# Patient Record
Sex: Female | Born: 1965 | Race: Black or African American | Hispanic: No | Marital: Single
Health system: Southern US, Community
[De-identification: ages and names within clinical notes are randomized; demographics above are authoritative.]

## PROBLEM LIST (undated history)

## (undated) DIAGNOSIS — I10 Essential (primary) hypertension: Secondary | ICD-10-CM

## (undated) DIAGNOSIS — E079 Disorder of thyroid, unspecified: Secondary | ICD-10-CM

## (undated) DIAGNOSIS — I671 Cerebral aneurysm, nonruptured: Secondary | ICD-10-CM

## (undated) DIAGNOSIS — I4719 Other supraventricular tachycardia: Secondary | ICD-10-CM

## (undated) DIAGNOSIS — R002 Palpitations: Secondary | ICD-10-CM

## (undated) HISTORY — DX: Essential (primary) hypertension: I10

## (undated) HISTORY — DX: Palpitations: R00.2

---

## 1994-05-21 ENCOUNTER — Ambulatory Visit: Admit: 1994-05-21 | Disposition: A | Discharge status: Home / Self Care | Payer: Self-pay | Source: Ambulatory Visit

## 1998-11-09 ENCOUNTER — Ambulatory Visit
Admit: 1998-11-09 | Disposition: A | Discharge status: Home / Self Care | Payer: Self-pay | Source: Ambulatory Visit | Admitting: Otolaryngology

## 2001-06-15 ENCOUNTER — Ambulatory Visit
Admit: 2001-06-15 | Disposition: A | Discharge status: Home / Self Care | Payer: Self-pay | Source: Ambulatory Visit | Admitting: Gynecology

## 2001-07-14 ENCOUNTER — Inpatient Hospital Stay
Admission: AD | Admit: 2001-07-14 | Disposition: A | Discharge status: Home / Self Care | Payer: Self-pay | Source: Ambulatory Visit | Admitting: Obstetrics & Gynecology

## 2005-08-16 ENCOUNTER — Ambulatory Visit
Admit: 2005-08-16 | Disposition: A | Discharge status: Home / Self Care | Payer: Self-pay | Source: Ambulatory Visit | Admitting: Internal Medicine

## 2007-10-06 ENCOUNTER — Ambulatory Visit
Admit: 2007-10-06 | Disposition: A | Discharge status: Home / Self Care | Payer: Self-pay | Source: Ambulatory Visit | Admitting: Internal Medicine

## 2008-02-19 ENCOUNTER — Ambulatory Visit
Admit: 2008-02-19 | Disposition: A | Discharge status: Home / Self Care | Payer: Self-pay | Source: Ambulatory Visit | Admitting: Internal Medicine

## 2011-10-23 ENCOUNTER — Ambulatory Visit (INDEPENDENT_AMBULATORY_CARE_PROVIDER_SITE_OTHER): Payer: Exclusive Provider Organization | Admitting: Physician Assistant

## 2011-10-23 ENCOUNTER — Encounter (INDEPENDENT_AMBULATORY_CARE_PROVIDER_SITE_OTHER): Payer: Self-pay | Admitting: Physician Assistant

## 2011-10-23 VITALS — BP 148/87 | Ht 67.0 in | Wt 210.0 lb

## 2011-10-23 DIAGNOSIS — N6009 Solitary cyst of unspecified breast: Secondary | ICD-10-CM

## 2011-10-23 DIAGNOSIS — I10 Essential (primary) hypertension: Secondary | ICD-10-CM

## 2011-10-23 DIAGNOSIS — I499 Cardiac arrhythmia, unspecified: Secondary | ICD-10-CM

## 2011-10-23 MED ORDER — LISINOPRIL 10 MG PO TABS
10.00 mg | ORAL_TABLET | Freq: Every day | ORAL | Status: DC
Start: 2011-10-23 — End: 2012-01-20

## 2011-10-23 NOTE — Progress Notes (Signed)
Subjective:       Patient ID: Nicole Mcknight is a 46 y.o. female.    HPI    The following portions of the patient's history were reviewed and updated as appropriate: allergies, current medications, past family history, past medical history, past social history, past surgical history and problem list.    Review of Systems        Objective:    Physical Exam  EKG- Sinus arrhythmia, PVS's noted, no ischemic findings      Assessment:             Plan:

## 2011-10-23 NOTE — Progress Notes (Signed)
Subjective:       Patient ID: Nicole Mcknight is a 46 y.o. female.    HPI  New Patient  Hx HTN- previously controlled with Lisinopril, but she has only 7 pills left and using them sparingly. Last dose was 2 days ago.  Hx- fibrocystic breasts. Her last mammo was 09/26/2011 which confirmed fibrocystic breast and she will need to have a FNA done (needs referral). She has noticed a new R breast lump since her mammo as well- it is tender to the touch.    Pt also c/o chest palpitations for 2 weeks now. Sx are occuring several times/day and has occasional SOB associated, no CP noted. She has cut out coffee and alcohol since sx began. Does admit to increase stress past 2 weeks- her son just got married.   She was seen by cardio several years ago for similar sx which resolved at that time.    The following portions of the patient's history were reviewed and updated as appropriate: allergies, current medications, past family history, past medical history, past social history, past surgical history and problem list.    Review of Systems   Constitutional: Negative for fever, activity change and fatigue.   HENT: Negative.    Respiratory: Positive for shortness of breath (occasional). Negative for wheezing.    Cardiovascular: Positive for palpitations. Negative for chest pain and leg swelling.   Gastrointestinal: Negative.    Genitourinary:        Breast lump/cysts   Neurological: Negative for dizziness, weakness, numbness and headaches.           Objective:    Physical Exam   Constitutional: She is oriented to person, place, and time. She appears well-developed and well-nourished. No distress.   HENT:   Head: Normocephalic.   Nose: Nose normal.   Mouth/Throat: Oropharynx is clear and moist.   Eyes: Pupils are equal, round, and reactive to light.   Neck: Normal range of motion. Neck supple. No thyromegaly present.   Cardiovascular: Normal rate, normal heart sounds and normal pulses.  An irregular rhythm present.  Extrasystoles  are present.   No murmur heard.  Pulmonary/Chest: Effort normal and breath sounds normal. No respiratory distress. Right breast exhibits mass and tenderness. Left breast exhibits mass and tenderness.       Lymphadenopathy:     She has no cervical adenopathy.   Neurological: She is alert and oriented to person, place, and time.   Skin: Skin is warm and dry.   Psychiatric: She has a normal mood and affect. Her behavior is normal. Judgment and thought content normal.           Assessment:       HTN  Frequent PVC's  Breast lumps/cysts      Plan:       1. Essential hypertension, benign  lisinopril (PRINIVIL,ZESTRIL) 10 MG tablet   2. Breast cyst  US Guided Aspiration Breast Cyst SGL Left, US Breast Right   3. Arrhythmia  Holter Monitor- 24 Hour, Holter Monitor- 24 Hour     Refilled Lisinopril  Arranged for Holter monitor today at [facility]. Will consult with Cardio after- may change BP med to beta blocker  Breast u/s and FNA - to be scheduled by pt

## 2011-10-27 ENCOUNTER — Encounter (INDEPENDENT_AMBULATORY_CARE_PROVIDER_SITE_OTHER): Payer: Self-pay

## 2011-11-24 ENCOUNTER — Encounter (INDEPENDENT_AMBULATORY_CARE_PROVIDER_SITE_OTHER): Payer: Self-pay | Admitting: Physician Assistant

## 2011-11-28 ENCOUNTER — Telehealth (INDEPENDENT_AMBULATORY_CARE_PROVIDER_SITE_OTHER): Payer: Self-pay

## 2011-11-28 NOTE — Telephone Encounter (Signed)
Left message for patient to call back.     No significant arrythmia noted. How is she feeling? Symptomatic?

## 2012-01-20 ENCOUNTER — Encounter (INDEPENDENT_AMBULATORY_CARE_PROVIDER_SITE_OTHER): Payer: Self-pay | Admitting: Physician Assistant

## 2012-01-20 ENCOUNTER — Ambulatory Visit (INDEPENDENT_AMBULATORY_CARE_PROVIDER_SITE_OTHER): Payer: Exclusive Provider Organization | Admitting: Physician Assistant

## 2012-01-20 VITALS — BP 155/90 | HR 75 | Temp 97.6°F | Ht 67.0 in | Wt 204.0 lb

## 2012-01-20 DIAGNOSIS — I10 Essential (primary) hypertension: Secondary | ICD-10-CM

## 2012-01-20 DIAGNOSIS — N390 Urinary tract infection, site not specified: Secondary | ICD-10-CM

## 2012-01-20 LAB — POCT URINALYSIS DIPSTIX (10)(MULTI-TEST)
Bilirubin, UA POCT: NEGATIVE
Glucose, UA POCT: NEGATIVE mg/dL
Ketones, UA POCT: NEGATIVE mg/dL
Nitrite, UA POCT: NEGATIVE
POCT Spec Gravity, UA: 1.03 (ref 1.001–1.035)
POCT pH, UA: 6 (ref 5–8)
Protein, UA POCT: NEGATIVE mg/dL

## 2012-01-20 MED ORDER — CIPROFLOXACIN HCL 500 MG PO TABS
500.00 mg | ORAL_TABLET | Freq: Two times a day (BID) | ORAL | Status: AC
Start: 2012-01-20 — End: 2012-01-23

## 2012-01-20 MED ORDER — LISINOPRIL 20 MG PO TABS
20.00 mg | ORAL_TABLET | Freq: Every day | ORAL | Status: DC
Start: 2012-01-20 — End: 2012-07-17

## 2012-01-20 NOTE — Progress Notes (Signed)
Subjective:       Patient ID: Nicole Mcknight is a 46 y.o. female.    HPI  Urinary Tract Infection: Patient complains of dysuria, frequency and urgency She has had symptoms for 1 week.  Patient denies back pain and fever. Patient does hot ave a history of recurrent UTI.  Patient does not have a history of pyelonephritis.     Pt with PMhx of HTN. She is compliant with Lisinopril 10mg  qd. She does not monitor BP at home.  BP elevated today.  Overall feels well, no HA, no CP, no SOB.  No new medication or OTC supplements.    The following portions of the patient's history were reviewed and updated as appropriate: allergies, current medications, past family history, past medical history, past social history, past surgical history and problem list.    Review of Systems   Constitutional: Negative for fever, chills, fatigue and unexpected weight change.   HENT: Negative.    Eyes: Negative for pain and visual disturbance.   Respiratory: Negative.  Negative for cough, chest tightness and shortness of breath.    Cardiovascular: Negative for chest pain, palpitations and leg swelling.   Gastrointestinal: Negative.  Negative for nausea, vomiting and abdominal pain.   Genitourinary: Positive for dysuria, urgency and frequency. Negative for flank pain, vaginal discharge and difficulty urinating.   Skin: Negative.    Neurological: Negative for weakness and headaches.           Objective:    Physical Exam   Vitals reviewed.  Constitutional: She is oriented to person, place, and time. She appears well-developed and well-nourished.   HENT:   Head: Normocephalic.   Nose: Nose normal.   Mouth/Throat: Oropharynx is clear and moist.   Eyes: Pupils are equal, round, and reactive to light.   Neck: Normal range of motion. Neck supple. Carotid bruit is not present. No thyromegaly present.   Cardiovascular: Normal rate and regular rhythm.  Exam reveals no gallop and no friction rub.    No murmur heard.  Pulmonary/Chest: Effort normal and  breath sounds normal. No respiratory distress.   Abdominal: Soft. Bowel sounds are normal. She exhibits no distension. There is no tenderness. There is no guarding and no CVA tenderness.   Musculoskeletal: She exhibits no edema.   Lymphadenopathy:     She has no cervical adenopathy.   Neurological: She is alert and oriented to person, place, and time.   Skin: Skin is warm and dry.           Assessment:       1. Essential hypertension, benign    2. UTI (lower urinary tract infection)            Plan:       1. Essential hypertension, benign  Increase lisinopril (PRINIVIL,ZESTRIL) 20 MG tablet qd   2. UTI (lower urinary tract infection)  ciprofloxacin (CIPRO) 500 MG tablet bid x 3d  Urine culture, POCT UA Dipstix (10)(Multi-Test)     Patient Instructions   Increase BP Medication Lisinopril to 20 mg/day  Follow up to recheck BP in 2-4 weeks    Low Sodium diet      Monitor BP at home    Risk & Benefits of the new medication(s) were explained to the pt (and family) who appeared to understand & agree to the treatment plan.

## 2012-01-20 NOTE — Assessment & Plan Note (Signed)
Relevant Hx:  Course:  Daily Update:  Today's Plan:

## 2012-01-20 NOTE — Patient Instructions (Signed)
Increase BP Medication Lisinopril to 20 mg/day  Follow up to recheck BP in 2-4 weeks    Low Sodium diet

## 2012-01-23 LAB — URINE CULTURE

## 2012-01-23 LAB — SENSITIVITY I

## 2012-02-19 ENCOUNTER — Telehealth (INDEPENDENT_AMBULATORY_CARE_PROVIDER_SITE_OTHER): Payer: Self-pay | Admitting: Physician Assistant

## 2012-02-19 NOTE — Telephone Encounter (Signed)
Left message for patient to call back  

## 2012-02-25 ENCOUNTER — Ambulatory Visit (INDEPENDENT_AMBULATORY_CARE_PROVIDER_SITE_OTHER): Payer: Exclusive Provider Organization | Admitting: Physician Assistant

## 2012-03-05 ENCOUNTER — Telehealth (INDEPENDENT_AMBULATORY_CARE_PROVIDER_SITE_OTHER): Payer: Self-pay

## 2012-03-05 NOTE — Telephone Encounter (Signed)
**  Is the patient taking current medications? YES/NO  Has there been any changes to current medication list? (additional meds/discontinued meds)    **Document any barriers/adverse reactions to current medications:  CURRENT BARRIERS TO TAKING MEDICATION:       **Has the patient sought any care outside of the [facility]? YES/NO    If YES:  **Patient has been instructed to bring/send records of incident at outside facility (HAVE PATIENT FILL OUT RECORD RELEASE FORM)    **Patient instructed to bring any self-referral information to the appointment.    **Instructed patient to bring in any log books they have. (BP/blood sugar, etc...) to the upcoming visit.

## 2012-03-10 ENCOUNTER — Ambulatory Visit (INDEPENDENT_AMBULATORY_CARE_PROVIDER_SITE_OTHER): Payer: Exclusive Provider Organization | Admitting: Physician Assistant

## 2012-04-14 ENCOUNTER — Ambulatory Visit (INDEPENDENT_AMBULATORY_CARE_PROVIDER_SITE_OTHER): Payer: Exclusive Provider Organization | Admitting: Physician Assistant

## 2012-04-14 ENCOUNTER — Encounter (INDEPENDENT_AMBULATORY_CARE_PROVIDER_SITE_OTHER): Payer: Exclusive Provider Organization | Admitting: Physician Assistant

## 2012-04-20 ENCOUNTER — Encounter (INDEPENDENT_AMBULATORY_CARE_PROVIDER_SITE_OTHER): Payer: Exclusive Provider Organization | Admitting: Physician Assistant

## 2012-04-29 ENCOUNTER — Encounter (INDEPENDENT_AMBULATORY_CARE_PROVIDER_SITE_OTHER): Payer: Exclusive Provider Organization | Admitting: Physician Assistant

## 2012-05-31 NOTE — Discharge Summary (Unsigned)
Nicole Mcknight, Nicole Mcknight      MEDICAL RECORD NUMBER:        [id number]            ATTENDING PHYSICIAN:          Jamesetta Orleans, MD      DATE OF ADMISSION:            07/13/2001      DATE OF DISCHARGE:            07/16/2001            ADMISSION DIAGNOSIS:                      Term pregnancy, active labor.            PROCEDURES:                               Spontaneous vaginal delivery,      viable female infant.  Repair of a first degree laceration.            HOSPITAL COURSE:                          The patient was admitted in      active labor at 8.0 cm, did well and she was admitted at 3:40 and was      delivered at 4:07.  Please see the delivery note.  She did well, as did the      baby post delivery.  She was transferred to [facility] where she      continued to do well and is ready for discharge on the morning of February      7th.  She has been given preprinted instructions.  She is to take Motrin      for discomfort.  She is to be seen in the office in six weeks.            ___________________________________      Jamesetta Orleans, MD            Dictated by: Spero Curb            :amg:ad      D:    07/16/2001      T:    07/20/2001      #:    [id number]      N:    [id number]            cc::  Spero Curb

## 2012-07-17 ENCOUNTER — Other Ambulatory Visit (INDEPENDENT_AMBULATORY_CARE_PROVIDER_SITE_OTHER): Payer: Self-pay | Admitting: Physician Assistant

## 2012-07-18 NOTE — Telephone Encounter (Signed)
Due for follow up

## 2012-07-19 NOTE — Telephone Encounter (Signed)
LM for patient to call back and schedule appt

## 2012-08-18 ENCOUNTER — Encounter (INDEPENDENT_AMBULATORY_CARE_PROVIDER_SITE_OTHER): Payer: Exclusive Provider Organization | Admitting: Physician Assistant

## 2012-08-20 ENCOUNTER — Other Ambulatory Visit (INDEPENDENT_AMBULATORY_CARE_PROVIDER_SITE_OTHER): Payer: Self-pay | Admitting: Physician Assistant

## 2012-08-24 ENCOUNTER — Encounter (INDEPENDENT_AMBULATORY_CARE_PROVIDER_SITE_OTHER): Payer: Exclusive Provider Organization | Admitting: Physician Assistant

## 2012-09-13 ENCOUNTER — Other Ambulatory Visit (INDEPENDENT_AMBULATORY_CARE_PROVIDER_SITE_OTHER): Payer: Self-pay | Admitting: Physician Assistant

## 2012-09-13 ENCOUNTER — Ambulatory Visit (INDEPENDENT_AMBULATORY_CARE_PROVIDER_SITE_OTHER): Payer: Exclusive Provider Organization | Admitting: Physician Assistant

## 2012-09-13 ENCOUNTER — Encounter (INDEPENDENT_AMBULATORY_CARE_PROVIDER_SITE_OTHER): Payer: Self-pay | Admitting: Physician Assistant

## 2012-09-13 VITALS — BP 142/92 | HR 79 | Ht 67.0 in | Wt 208.0 lb

## 2012-09-13 DIAGNOSIS — I10 Essential (primary) hypertension: Secondary | ICD-10-CM

## 2012-09-13 DIAGNOSIS — Z124 Encounter for screening for malignant neoplasm of cervix: Secondary | ICD-10-CM

## 2012-09-13 DIAGNOSIS — N926 Irregular menstruation, unspecified: Secondary | ICD-10-CM

## 2012-09-13 DIAGNOSIS — Z23 Encounter for immunization: Secondary | ICD-10-CM

## 2012-09-13 DIAGNOSIS — Z01419 Encounter for gynecological examination (general) (routine) without abnormal findings: Secondary | ICD-10-CM

## 2012-09-13 DIAGNOSIS — Z0000 Encounter for general adult medical examination without abnormal findings: Secondary | ICD-10-CM

## 2012-09-13 MED ORDER — LISINOPRIL-HYDROCHLOROTHIAZIDE 20-12.5 MG PO TABS
1.0000 | ORAL_TABLET | Freq: Every day | ORAL | Status: DC
Start: 2012-09-13 — End: 2013-02-04

## 2012-09-13 NOTE — Progress Notes (Signed)
Subjective:       Patient ID: Nicole Mcknight is a 47 y.o. female.    HPI  Patient Presents for an annual well visit and for GYN visit today.  Overall feeling well.  PMHx-HTN  Diet - semi healthy, does NOT follow low sodium diet  Works - for [employer] -  occasional walk or tennis game  Dental UTD  Vision exam - has never had  Vaccines - Td today  Last pap/pelvic - 2013  Patient's last menstrual period was 08/31/2012.  However, she has had mid cycle spotting for 2 months.   Last mammo - 2013, fibrocystic breast noted. U/S guided bx advised, pt she has not had this done      The following portions of the patient's history were reviewed and updated as appropriate: allergies, current medications, past family history, past medical history, past social history, past surgical history and problem list.    Review of Systems   Constitutional: Negative for fever, activity change, fatigue and unexpected weight change.   HENT: Negative for hearing loss, congestion, sore throat, rhinorrhea and neck pain.    Eyes: Negative for discharge and visual disturbance.   Respiratory: Negative for cough and shortness of breath.    Cardiovascular: Negative for chest pain, palpitations and leg swelling.   Gastrointestinal: Negative for nausea, vomiting, abdominal pain, diarrhea, constipation and blood in stool.   Genitourinary: Positive for menstrual problem (mid cycle bleeding x 2 mos). Negative for dysuria, urgency, frequency, vaginal discharge, difficulty urinating, vaginal pain, pelvic pain and dyspareunia.   Musculoskeletal: Negative.  Negative for back pain, arthralgias and gait problem.   Skin: Negative for rash.   Neurological: Negative for dizziness, weakness, numbness and headaches.   Psychiatric/Behavioral: Negative for behavioral problems, sleep disturbance and dysphoric mood. The patient is not nervous/anxious.    All other systems reviewed and are negative.            Objective:    Physical Exam   Vitals  reviewed.  Constitutional: She is oriented to person, place, and time. She appears well-developed and well-nourished.   HENT:   Head: Normocephalic.   Right Ear: Tympanic membrane normal.   Left Ear: Tympanic membrane normal.   Nose: Nose normal.   Mouth/Throat: Oropharynx is clear and moist.   Eyes: Conjunctivae normal are normal. Pupils are equal, round, and reactive to light.   Neck: Normal range of motion. Neck supple. No thyromegaly present.   Cardiovascular: Normal rate, regular rhythm and normal heart sounds.    No murmur heard.  Pulmonary/Chest: Effort normal and breath sounds normal. No respiratory distress. Right breast exhibits no mass, no nipple discharge and no tenderness. Left breast exhibits no mass, no nipple discharge and no tenderness. Breasts are symmetrical.   Abdominal: Bowel sounds are normal. She exhibits no distension and no mass. There is no tenderness. There is no guarding.   Genitourinary: Vagina normal and uterus normal. No breast swelling or tenderness. Cervix exhibits no motion tenderness, no discharge and no friability. Right adnexum displays no mass. Left adnexum displays no mass. No vaginal discharge found.   Musculoskeletal: Normal range of motion. She exhibits no edema.   Lymphadenopathy:     She has no cervical adenopathy.        Right: No inguinal adenopathy present.        Left: No inguinal adenopathy present.   Neurological: She is alert and oriented to person, place, and time.   Skin: Skin is warm  and dry.   Psychiatric: She has a normal mood and affect.     EKG- NSR, No acute findings        Assessment:       1. Routine general medical examination at a health care facility    2. Routine gynecological examination    3. Essential hypertension, benign    4. Irregular menses    5. Need for TD vaccine            Plan:       1. Routine general medical examination at a health care facility  Lipid panel, Comprehensive metabolic panel, CBC and differential, Vitamin D 25 hydroxy,  Urinalysis, TSH   2. Routine gynecological examination  PAP IG, HPV-HR Profile, Mammo Digital Screening Bilateral W Cad   3. Essential hypertension, benign  PR ECG ROUTINE ECG W/LEAST 12 LDS W/I&R, Ambulatory referral to Ophthalmology,   STOP LISINOPRIL 20 mg qd  START lisinopril-hydrochlorothiazide (ZESTORETIC) 20-12.5 MG per tablet   4. Irregular menses  US Pelvis Complete   5. Need for TD vaccine  Td vaccine greater than or equal to 7yo with preservative IM     Patient Instructions     --Nutrition: Stressed importance of moderation in sodium/caffeine intake, saturated fat and cholesterol, caloric balance, sufficient intake of fresh fruits, vegetables, fiber, calcium, iron   --Discussed the issue of calcium supplement, and the daily use of baby aspirin (81 mg)   --Discussed self breast exam   --Exercise: Stressed the importance of regular exercise, 30 minutes daily 5 times per week.   --Dental health: Discussed importance of regular tooth brushing, flossing, and dental visits.   --Immunizations reviewed. 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  Screening labs requested  Dietary sodium restriction.  Regular aerobic exercise.  Check blood pressures 2 times weekly and record.  Follow up: 4 weeks and as needed.

## 2012-09-13 NOTE — Patient Instructions (Signed)
--  Nutrition: Stressed importance of moderation in sodium/caffeine intake, saturated fat and cholesterol, caloric balance, sufficient intake of fresh fruits, vegetables, fiber, calcium, iron   --Discussed the issue of calcium supplement, and the daily use of baby aspirin (81 mg)   --Discussed self breast exam   --Exercise: Stressed the importance of regular exercise, 30 minutes daily 5 times per week.   --Dental health: Discussed importance of regular tooth brushing, flossing, and dental visits.   --Immunizations reviewed. UTD   --Discussed benefits of screening colon cancer with Colonoscopy starting at age 72  --Pap results in approx 1 week  --Mammo requested  --Labs as requested    Hypertension plan and Goals  Goal for blood pressure level: 130/90  Medication: discontinue Lisinopril 20mg  and begin Lisinopril HCTZ 20/12.5 mg.  Screening labs requested  Dietary sodium restriction.  Regular aerobic exercise.  Check blood pressures 2 times weekly and record.  Follow up: 4 weeks and as needed.

## 2012-09-16 LAB — VITAMIN D-25 HYDROXY (D2/D3/TOTAL)
25-Hydroxy D2: <4 ng/mL
Vitamin D 25-OH D3: 7 ng/mL
Vitamin D 25-OH Total: 7 ng/mL — ABNORMAL LOW (ref 30–100)

## 2012-09-16 LAB — CBC AND DIFFERENTIAL
Atypical Lymphocytes %: 0 %
Baso(Absolute): 34 cells/uL (ref 0–200)
Basophils: 0.5 %
Eosinophils Absolute: 60 cells/uL (ref 15–500)
Eosinophils: 0.9 %
Hematocrit: 42.7 % (ref 35.0–45.0)
Hemoglobin: 13.9 g/dL (ref 11.7–15.5)
Lymphocytes Absolute: 1005 cells/uL (ref 850–3900)
Lymphocytes: 15 %
MCH: 30.9 pg (ref 27–33)
MCHC: 32.5 g/dL (ref 32–36)
MCV: 95 fL (ref 80–100)
MPV: 10.1 fL (ref 7.5–11.5)
Monocytes Absolute: 529 cells/uL (ref 200–950)
Monocytes: 7.9 %
Neutrophils Absolute: 5072 cells/uL (ref 1500–7800)
Neutrophils: 75.7 %
Platelets: 240 10*3/uL (ref 140–400)
RBC: 4.49 10*6/uL (ref 3.80–5.10)
RDW: 14 % (ref 11.0–15.0)
WBC: 6.7 10*3/uL (ref 3.8–10.8)

## 2012-09-16 LAB — LIPID PANEL
Cholesterol / HDL Ratio: 1.8 (ref 0.0–5.0)
Cholesterol: 125 MG/DL (ref 125–200)
HDL: 70 MG/DL (ref >=46)
LDL Calculated: 47 MG/DL (ref <130)
Non HDL Cholesterol (LDL and VLDL): 55 mg/dL
Triglycerides: 38 MG/DL (ref <150)

## 2012-09-16 LAB — URINALYSIS
Bilirubin, UA: NEGATIVE
Blood, UA: NEGATIVE
Glucose Qualitative: NEGATIVE
Ketones UA: NEGATIVE
Leukocyte Esterase, UA: NEGATIVE
NITRITE: NEGATIVE
Protein, UA: NEGATIVE
Specific Gravity, UA: 1.012 (ref 1.001–1.035)
pH: 8 (ref 5.0–8.0)

## 2012-09-16 LAB — COMPREHENSIVE METABOLIC PANEL
ALT: 9 U/L (ref 6–29)
AST (SGOT): 15 U/L (ref 10–35)
Albumin/Globulin Ratio: 1.2 (ref 1.0–2.5)
Albumin: 4.1 G/DL (ref 3.6–5.1)
Alkaline Phosphatase: 100 U/L (ref 33–115)
BUN: 10 MG/DL (ref 7–25)
Bilirubin, Total: 0.5 MG/DL (ref 0.2–1.2)
CO2: 22 mmol/L (ref 19–30)
Calcium: 9.2 MG/DL (ref 8.6–10.2)
Chloride: 104 mmol/L (ref 98–110)
Creatinine: 0.78 mg/dL (ref 0.50–1.10)
EGFR African American: 104 mL/min/{1.73_m2} (ref >=60)
EGFR: 90 mL/min/{1.73_m2} (ref >=60)
Globulin: 3.3 G/DL (ref 1.9–3.7)
Glucose: 73 MG/DL (ref 65–99)
Potassium: 4.2 mmol/L (ref 3.5–5.3)
Protein, Total: 7.4 G/DL (ref 6.1–8.1)
Sodium: 141 mmol/L (ref 135–146)

## 2012-09-16 LAB — TSH: TSH: 1.57 mIU/L (ref 0.40–4.50)

## 2012-09-17 ENCOUNTER — Other Ambulatory Visit (INDEPENDENT_AMBULATORY_CARE_PROVIDER_SITE_OTHER): Payer: Self-pay | Admitting: Physician Assistant

## 2012-09-17 LAB — THINPREP IMAGING PAP & HPV MRNA E6/E7.

## 2012-09-17 LAB — HPV DNA, HIGH RISK: HPV DNA, High Risk: NOT DETECTED

## 2012-09-17 MED ORDER — VITAMIN D (ERGOCALCIFEROL) 1.25 MG (50000 UT) PO CAPS
ORAL_CAPSULE | ORAL | Status: DC
Start: 2012-09-17 — End: 2013-05-23

## 2012-10-13 ENCOUNTER — Ambulatory Visit (INDEPENDENT_AMBULATORY_CARE_PROVIDER_SITE_OTHER): Payer: Exclusive Provider Organization | Admitting: Physician Assistant

## 2012-10-26 ENCOUNTER — Ambulatory Visit (INDEPENDENT_AMBULATORY_CARE_PROVIDER_SITE_OTHER): Payer: Exclusive Provider Organization | Admitting: Physician Assistant

## 2012-10-26 ENCOUNTER — Encounter (INDEPENDENT_AMBULATORY_CARE_PROVIDER_SITE_OTHER): Payer: Self-pay | Admitting: Physician Assistant

## 2012-10-26 VITALS — BP 150/88 | HR 60 | Temp 99.2°F | Ht 67.0 in | Wt 208.0 lb

## 2012-10-26 DIAGNOSIS — E559 Vitamin D deficiency, unspecified: Secondary | ICD-10-CM

## 2012-10-26 DIAGNOSIS — I10 Essential (primary) hypertension: Secondary | ICD-10-CM

## 2012-10-26 NOTE — Assessment & Plan Note (Signed)
Relevant Hx:  Course:  Daily Update:  Today's Plan:

## 2012-10-26 NOTE — Progress Notes (Signed)
Has the patient sought any care outside of the [facility]? NO    1.  Over the last two weeks, have you been bothered by feeling  down, depressed, or hopeless?  3    2.  Over the last two weeks, have you been bothered by little  interest or pleasure in doing things? 1        Scoring:    Not at all: 0  Several days: 1  More than half the days:2  Nearly every day: 3      ONLY IF A 3 IS SCORED ON EITHER OF THE 2 QUESTIONS, THEN GIVE OUT PHQ9

## 2012-10-26 NOTE — Progress Notes (Signed)
Subjective:       Patient ID: Nicole Mcknight is a 47 y.o. female.    HPI  Problem   Vitamin D Deficiency    Pt with newly diagnosed Vit D Deficency. She was prescribed Vit D 50,000units q week but has not started taking yet.    Lab Results   Component Value Date    VITD 7* 09/14/2012    VITD 7 09/14/2012          Essential Hypertension, Benign    [facility] Letter given 01/20/12  Hypertension: Patient is here for evaluation of elevated blood pressures.   She did NOT start the Lisinopril HCT 20/12.5mg  after her last CPE. She was afraid of experiencing SE to new med.    Cardiac symptoms none.   Patient denies chest pain, dyspnea, lower extremity edema and palpitations.    Cardiovascular risk factors: hypertension.   Use of agents associated with hypertension: none.   History of target organ damage: none    BP Readings from Last 3 Encounters:   10/26/12 150/88   09/13/12 142/92   01/20/12 155/90                  The following portions of the patient's history were reviewed and updated as appropriate: allergies, current medications, past family history, past medical history, past social history, past surgical history and problem list.    Review of Systems   Constitutional: Negative for fever, fatigue and unexpected weight change.   HENT: Negative.    Eyes: Negative for pain and visual disturbance.   Respiratory: Negative for cough, chest tightness and shortness of breath.    Cardiovascular: Negative for chest pain, palpitations and leg swelling.   Gastrointestinal: Negative.    Skin: Negative.    Neurological: Negative for weakness and headaches.           Objective:    Physical Exam   Vitals reviewed.  Constitutional: She is oriented to person, place, and time. She appears well-developed and well-nourished.   HENT:   Head: Normocephalic.   Nose: Nose normal.   Mouth/Throat: Oropharynx is clear and moist.   Eyes: Pupils are equal, round, and reactive to light.   Neck: Normal range of motion. Neck supple. Carotid bruit is not  present. No thyromegaly present.   Cardiovascular: Normal rate and regular rhythm.  Exam reveals no gallop and no friction rub.    No murmur heard.  Pulmonary/Chest: Effort normal and breath sounds normal. No respiratory distress.   Abdominal: Soft. There is no tenderness.   Musculoskeletal: She exhibits no edema.   Lymphadenopathy:     She has no cervical adenopathy.   Neurological: She is alert and oriented to person, place, and time.   Skin: Skin is warm and dry.           Assessment:       1. Essential hypertension, benign    2. Vitamin D deficiency            Plan:       1. Essential hypertension, benign    2. Vitamin D deficiency      PLEASE START LISINOPRIL HCTZ 20/12.5MG /DAY  PLEASE START VIT D 50000 UNITS Q WEEK X 12 WEEK  FOLLOW UP 12 WEEKS, SOONER PRN

## 2013-02-04 ENCOUNTER — Other Ambulatory Visit (INDEPENDENT_AMBULATORY_CARE_PROVIDER_SITE_OTHER): Payer: Self-pay | Admitting: Physician Assistant

## 2013-05-04 ENCOUNTER — Other Ambulatory Visit (INDEPENDENT_AMBULATORY_CARE_PROVIDER_SITE_OTHER): Payer: Self-pay | Admitting: Physician Assistant

## 2013-05-04 NOTE — Telephone Encounter (Signed)
Over due for follow up

## 2013-05-10 NOTE — Telephone Encounter (Signed)
Left detailed message for patient to call back about med refill.

## 2013-05-23 ENCOUNTER — Ambulatory Visit (INDEPENDENT_AMBULATORY_CARE_PROVIDER_SITE_OTHER): Payer: Self-pay | Admitting: Physician Assistant

## 2013-05-23 ENCOUNTER — Encounter (INDEPENDENT_AMBULATORY_CARE_PROVIDER_SITE_OTHER): Payer: Self-pay | Admitting: Physician Assistant

## 2013-05-23 VITALS — BP 130/75 | HR 64 | Temp 98.4°F | Ht 67.0 in | Wt 209.0 lb

## 2013-05-23 DIAGNOSIS — I10 Essential (primary) hypertension: Secondary | ICD-10-CM

## 2013-05-23 DIAGNOSIS — E559 Vitamin D deficiency, unspecified: Secondary | ICD-10-CM

## 2013-05-23 MED ORDER — LISINOPRIL-HYDROCHLOROTHIAZIDE 20-12.5 MG PO TABS
1.0000 | ORAL_TABLET | Freq: Every day | ORAL | Status: DC
Start: 2013-05-23 — End: 2014-01-30

## 2013-05-23 NOTE — Assessment & Plan Note (Signed)
Relevant Hx:  Course:  Daily Update:  Today's Plan:

## 2013-05-23 NOTE — Progress Notes (Signed)
Subjective:       Patient ID: Nicole Mcknight is a 47 y.o. female.    HPI  Problem   Vitamin D Deficiency    Pt with hx Vit D Deficency. She was prescribed Vit D 50,000units q week x 12 weeks and completed course. Due to ck Vit D levels today    Lab Results   Component Value Date    VITD 7* 09/14/2012    VITD 7 09/14/2012          Essential Hypertension, Benign    [facility] Letter given 01/20/12  Hypertension: Patient is here for evaluation of elevated blood pressures.   She has been compliant with Lisinopril HCT 20/12.5mg  and monitoring BP at home.  Usually in 120'/80's  No SE to med noted    Cardiac symptoms none.   Patient denies chest pain, dyspnea, lower extremity edema and palpitations.    Cardiovascular risk factors: hypertension.   Use of agents associated with hypertension: none.   History of target organ damage: none    BP Readings from Last 3 Encounters:   05/23/13 130/75   10/26/12 150/88   09/13/12 142/92              The following portions of the patient's history were reviewed and updated as appropriate: allergies, current medications, past family history, past medical history, past social history, past surgical history and problem list.    Review of Systems   Constitutional: Negative for fever, fatigue and unexpected weight change.   HENT: Negative.    Eyes: Negative for pain and visual disturbance.   Respiratory: Negative for cough, chest tightness and shortness of breath.    Cardiovascular: Negative for chest pain, palpitations and leg swelling.   Gastrointestinal: Negative.    Skin: Negative.    Neurological: Negative for weakness and headaches.           Objective:    Physical Exam   Vitals reviewed.  Constitutional: She is oriented to person, place, and time. She appears well-developed and well-nourished.   HENT:   Head: Normocephalic.   Nose: Nose normal.   Mouth/Throat: Oropharynx is clear and moist.   Eyes: Pupils are equal, round, and reactive to light.   Neck: Normal range of motion. Neck supple.  Carotid bruit is not present. No thyromegaly present.   Cardiovascular: Normal rate and regular rhythm.  Exam reveals no gallop and no friction rub.    No murmur heard.  Pulmonary/Chest: Effort normal and breath sounds normal. No respiratory distress.   Abdominal: Soft. There is no tenderness.   Musculoskeletal: She exhibits no edema.   Lymphadenopathy:     She has no cervical adenopathy.   Neurological: She is alert and oriented to person, place, and time.   Skin: Skin is warm and dry.           Assessment:       1. Essential hypertension, benign    2. Vitamin D deficiency            Plan:       1. Essential hypertension, benign  Comprehensive metabolic panel    lisinopril-hydrochlorothiazide (PRINZIDE,ZESTORETIC) 20-12.5 MG per tablet   2. Vitamin D deficiency  Vitamin D 25 hydroxy     Hypertension plan and Goals  Goal for blood pressure level: <140/90  Medication: no change.  Dietary sodium restriction.  Regular aerobic exercise.  Check blood pressures 3 times weekly and record.  Follow up: 6 months and as needed.

## 2013-05-24 LAB — COMPREHENSIVE METABOLIC PANEL
ALT: 11 U/L (ref 6–29)
AST (SGOT): 18 U/L (ref 10–35)
Albumin/Globulin Ratio: 1.2 (ref 1.0–2.5)
Albumin: 3.9 G/DL (ref 3.6–5.1)
Alkaline Phosphatase: 81 U/L (ref 33–115)
BUN: 18 MG/DL (ref 7–25)
Bilirubin, Total: 0.4 MG/DL (ref 0.2–1.2)
CO2: 27 mmol/L (ref 19–30)
Calcium: 9 MG/DL (ref 8.6–10.2)
Chloride: 105 mmol/L (ref 98–110)
Creatinine: 0.83 mg/dL (ref 0.50–1.10)
EGFR African American: 96 mL/min/{1.73_m2} (ref >=60)
EGFR: 83 mL/min/{1.73_m2} (ref >=60)
Globulin: 3.3 G/DL (ref 1.9–3.7)
Glucose: 67 MG/DL (ref 65–99)
Potassium: 4.2 mmol/L (ref 3.5–5.3)
Protein, Total: 7.2 G/DL (ref 6.1–8.1)
Sodium: 141 mmol/L (ref 135–146)

## 2013-05-24 LAB — VITAMIN D-25 HYDROXY (D2/D3/TOTAL)
25-Hydroxy D2: 13 ng/mL
Vitamin D 25-OH D3: 18 ng/mL
Vitamin D 25-OH Total: 31 ng/mL (ref 30–100)

## 2013-07-06 ENCOUNTER — Encounter (INDEPENDENT_AMBULATORY_CARE_PROVIDER_SITE_OTHER): Payer: Self-pay | Admitting: Physician Assistant

## 2013-07-06 ENCOUNTER — Ambulatory Visit (INDEPENDENT_AMBULATORY_CARE_PROVIDER_SITE_OTHER): Payer: Commercial Managed Care - POS | Admitting: Physician Assistant

## 2013-07-06 VITALS — BP 135/87 | HR 65 | Temp 98.5°F | Resp 16 | Ht 67.0 in | Wt 207.6 lb

## 2013-07-06 DIAGNOSIS — M25512 Pain in left shoulder: Secondary | ICD-10-CM

## 2013-07-06 DIAGNOSIS — M542 Cervicalgia: Secondary | ICD-10-CM

## 2013-07-06 DIAGNOSIS — R42 Dizziness and giddiness: Secondary | ICD-10-CM

## 2013-07-06 DIAGNOSIS — M25519 Pain in unspecified shoulder: Secondary | ICD-10-CM

## 2013-07-06 MED ORDER — NAPROXEN 500 MG PO TABS
500.0000 mg | ORAL_TABLET | Freq: Two times a day (BID) | ORAL | Status: DC
Start: 2013-07-06 — End: 2015-06-12

## 2013-07-06 NOTE — Progress Notes (Signed)
Subjective:       Patient ID: Nicole Mcknight is a 48 y.o. female.    Shoulder Pain   The pain is present in the left shoulder (neck). This is a new problem. The current episode started more than 1 month ago. There has been no history of extremity trauma. The problem occurs intermittently. The problem has been waxing and waning. The quality of the pain is described as aching. The pain is moderate. Pertinent negatives include no fever. She has tried nothing for the symptoms.     Pt also notes dizziness and vertigo starting last night. Sx seem to have improved today.  No meds taken. No HA, no vomiting, slt nausea noted    The following portions of the patient's history were reviewed and updated as appropriate: allergies, current medications, past family history, past medical history, past social history, past surgical history and problem list.    Review of Systems   Constitutional: Negative for fever, chills, activity change and fatigue.   HENT: Negative.    Eyes: Negative for photophobia, pain and visual disturbance.   Respiratory: Negative.    Cardiovascular: Negative.    Gastrointestinal: Positive for nausea. Negative for vomiting and diarrhea.   Musculoskeletal: Positive for arthralgias, neck pain and neck stiffness. Negative for joint swelling.   Skin: Negative.    Neurological: Positive for dizziness (improving). Negative for weakness.           Objective:    Physical Exam   Vitals reviewed.  Constitutional: She is oriented to person, place, and time. She appears well-developed and well-nourished. No distress.   HENT:   Head: Normocephalic.   Mouth/Throat: Oropharynx is clear and moist.   Eyes: Conjunctivae normal and EOM are normal. Pupils are equal, round, and reactive to light. Right eye exhibits no nystagmus. Left eye exhibits no nystagmus.   Neck: Normal range of motion. Neck supple. Muscular tenderness present. No spinous process tenderness present. No rigidity. No erythema and normal range of motion  present.   Cardiovascular: Normal rate and normal heart sounds.    Pulmonary/Chest: Effort normal and breath sounds normal.   Abdominal: Soft. Bowel sounds are normal.   Neurological: She is alert and oriented to person, place, and time. She has normal strength. No cranial nerve deficit or sensory deficit. She displays a negative Romberg sign. Coordination and gait normal.   Skin: Skin is warm.   Psychiatric: She has a normal mood and affect. Her behavior is normal. Judgment and thought content normal.           Assessment:       1. Shoulder pain, left    2. Neck pain    3. Dizziness            Plan:       1. Shoulder pain, left  naproxen (NAPROSYN) 500 MG tablet   2. Neck pain     3. Dizziness         Rest today,  Fluids, slow position changes  Call if vertigo sx return    For neck/shoulder pain:  Meds as prescribed  Ice to the area  Stretching  No heavy lifting  Call with any worsening sympoms    Risk & Benefits of the new medication(s) were explained to the pt (and family) who appeared to understand & agree to the treatment plan.

## 2013-07-07 ENCOUNTER — Telehealth (INDEPENDENT_AMBULATORY_CARE_PROVIDER_SITE_OTHER): Payer: Self-pay

## 2013-07-07 NOTE — Telephone Encounter (Signed)
Patient wants to check if the anti inflammatory medication prescribed yesterday is ok to take with a low dose aspirin? Patient takes saw on the label to not take it with aspirin. Patient also wanting to know if she should stop aspirin for the 2 weeks that she is going to be taking the naproxen.

## 2013-07-07 NOTE — Telephone Encounter (Signed)
I left a message for the patient to return my call.

## 2013-07-07 NOTE — Telephone Encounter (Signed)
That is fine  She can stop ASA while she takes the NAprosyn

## 2013-07-08 NOTE — Telephone Encounter (Signed)
Patient notified. Patient has mild nausea while on naproxen asks if its ok?

## 2013-12-01 ENCOUNTER — Encounter (INDEPENDENT_AMBULATORY_CARE_PROVIDER_SITE_OTHER): Payer: Self-pay | Admitting: Physician Assistant

## 2013-12-01 ENCOUNTER — Ambulatory Visit (INDEPENDENT_AMBULATORY_CARE_PROVIDER_SITE_OTHER): Payer: Commercial Managed Care - POS | Admitting: Physician Assistant

## 2013-12-01 VITALS — BP 111/74 | HR 60 | Temp 98.7°F | Ht 67.0 in | Wt 213.0 lb

## 2013-12-01 DIAGNOSIS — J0100 Acute maxillary sinusitis, unspecified: Secondary | ICD-10-CM

## 2013-12-01 MED ORDER — AMOXICILLIN-POT CLAVULANATE 875-125 MG PO TABS
1.0000 | ORAL_TABLET | Freq: Two times a day (BID) | ORAL | Status: AC
Start: 2013-12-01 — End: 2013-12-11

## 2013-12-01 NOTE — Progress Notes (Signed)
Subjective:       Patient ID: Nicole Mcknight is a 48 y.o. female.    Sinus Problem  This is a new problem. The current episode started 1 to 4 weeks ago. The problem is unchanged. There has been no fever. Associated symptoms include congestion, coughing, ear pain, headaches, sinus pressure, sneezing and a sore throat. Pertinent negatives include no shortness of breath. Past treatments include oral decongestants. The treatment provided mild relief.       The following portions of the patient's history were reviewed and updated as appropriate: allergies, current medications, past family history, past medical history, past social history, past surgical history and problem list.    Review of Systems   Constitutional: Positive for fatigue. Negative for fever.   HENT: Positive for congestion, ear pain, sinus pressure, sneezing and sore throat.    Eyes: Negative for discharge.   Respiratory: Positive for cough. Negative for shortness of breath.    Cardiovascular: Negative for chest pain.   Gastrointestinal: Negative.    Skin: Negative.    Neurological: Positive for headaches.           Objective:    Physical Exam   Constitutional: She is oriented to person, place, and time. She appears well-developed and well-nourished. No distress.   HENT:   Head: Normocephalic.   Right Ear: Tympanic membrane normal.   Left Ear: Tympanic membrane normal.   Nose: Mucosal edema and rhinorrhea present. Right sinus exhibits maxillary sinus tenderness. Left sinus exhibits maxillary sinus tenderness.   Mouth/Throat: Uvula is midline, oropharynx is clear and moist and mucous membranes are normal.   Neck: Normal range of motion. Neck supple. No thyromegaly present.   Cardiovascular: Normal rate, regular rhythm and normal heart sounds.    No murmur heard.  Pulmonary/Chest: Effort normal and breath sounds normal. No respiratory distress.   Neurological: She is alert and oriented to person, place, and time.   Skin: Skin is warm and dry.            Assessment:       1. Acute maxillary sinusitis, recurrence not specified             Plan:      Procedures    1. Acute maxillary sinusitis, recurrence not specified  amoxicillin-clavulanate (AUGMENTIN) 875-125 MG per tablet     Antibiotics as prescribed.  Nasal spray as needed  Advise sinus rinse.  F/U if symptoms persist.    Risk & Benefits of the new medication(s) were explained to the pt (and family) who appeared to understand & agree to the treatment plan.

## 2013-12-05 ENCOUNTER — Telehealth (INDEPENDENT_AMBULATORY_CARE_PROVIDER_SITE_OTHER): Payer: Self-pay

## 2013-12-05 NOTE — Telephone Encounter (Signed)
Patient wants to know if there [place] anything you can suggest medication prescribed she states it is not alleviating her symptoms she said she will finish the antibiotics.

## 2013-12-05 NOTE — Telephone Encounter (Signed)
Sinus rinse  otherwise follow up

## 2013-12-06 NOTE — Telephone Encounter (Signed)
Patient wondering if it was okay to take allergy medicine along with her antibiotic.

## 2014-01-29 ENCOUNTER — Ambulatory Visit
Admission: RE | Admit: 2014-01-29 | Discharge: 2014-01-29 | Disposition: A | Discharge status: Home / Self Care | Payer: Self-pay | Source: Ambulatory Visit | Attending: Internal Medicine | Admitting: Internal Medicine

## 2014-01-30 ENCOUNTER — Other Ambulatory Visit (INDEPENDENT_AMBULATORY_CARE_PROVIDER_SITE_OTHER): Payer: Self-pay | Admitting: Physician Assistant

## 2014-01-31 ENCOUNTER — Ambulatory Visit (INDEPENDENT_AMBULATORY_CARE_PROVIDER_SITE_OTHER): Payer: Self-pay | Admitting: Family Medicine

## 2014-05-01 ENCOUNTER — Encounter (INDEPENDENT_AMBULATORY_CARE_PROVIDER_SITE_OTHER): Payer: Self-pay

## 2014-06-12 ENCOUNTER — Encounter (INDEPENDENT_AMBULATORY_CARE_PROVIDER_SITE_OTHER): Payer: Self-pay | Admitting: Physician Assistant

## 2014-08-11 ENCOUNTER — Other Ambulatory Visit: Payer: Self-pay | Admitting: Internal Medicine

## 2014-08-11 ENCOUNTER — Ambulatory Visit
Admission: RE | Admit: 2014-08-11 | Discharge: 2014-08-11 | Disposition: A | Discharge status: Home / Self Care | Payer: Commercial Managed Care - POS | Source: Ambulatory Visit | Attending: Internal Medicine | Admitting: Internal Medicine

## 2014-08-11 DIAGNOSIS — M79603 Pain in arm, unspecified: Secondary | ICD-10-CM | POA: Insufficient documentation

## 2014-08-25 ENCOUNTER — Other Ambulatory Visit: Payer: Self-pay | Admitting: Orthopaedic Surgery

## 2014-08-25 DIAGNOSIS — M5030 Other cervical disc degeneration, unspecified cervical region: Secondary | ICD-10-CM

## 2014-08-31 ENCOUNTER — Ambulatory Visit: Payer: Commercial Managed Care - POS | Attending: Orthopaedic Surgery

## 2014-08-31 DIAGNOSIS — M4802 Spinal stenosis, cervical region: Secondary | ICD-10-CM | POA: Insufficient documentation

## 2014-08-31 DIAGNOSIS — M5030 Other cervical disc degeneration, unspecified cervical region: Secondary | ICD-10-CM | POA: Insufficient documentation

## 2014-08-31 DIAGNOSIS — M47892 Other spondylosis, cervical region: Secondary | ICD-10-CM | POA: Insufficient documentation

## 2014-08-31 DIAGNOSIS — M5032 Other cervical disc degeneration, mid-cervical region: Secondary | ICD-10-CM | POA: Insufficient documentation

## 2014-08-31 DIAGNOSIS — M5031 Other cervical disc degeneration,  high cervical region: Secondary | ICD-10-CM | POA: Insufficient documentation

## 2014-09-14 ENCOUNTER — Ambulatory Visit (INDEPENDENT_AMBULATORY_CARE_PROVIDER_SITE_OTHER): Payer: Commercial Managed Care - POS

## 2015-01-27 ENCOUNTER — Ambulatory Visit
Admission: RE | Admit: 2015-01-27 | Discharge: 2015-01-27 | Disposition: A | Discharge status: Home / Self Care | Payer: Self-pay | Source: Ambulatory Visit | Attending: Internal Medicine | Admitting: Internal Medicine

## 2015-06-12 ENCOUNTER — Emergency Department
Admission: EM | Admit: 2015-06-12 | Discharge: 2015-06-12 | Disposition: A | Discharge status: Home / Self Care | Payer: Commercial Managed Care - POS | Attending: Emergency Medicine | Admitting: Emergency Medicine

## 2015-06-12 ENCOUNTER — Emergency Department: Payer: Commercial Managed Care - POS

## 2015-06-12 DIAGNOSIS — I10 Essential (primary) hypertension: Secondary | ICD-10-CM | POA: Insufficient documentation

## 2015-06-12 DIAGNOSIS — Z7982 Long term (current) use of aspirin: Secondary | ICD-10-CM | POA: Insufficient documentation

## 2015-06-12 DIAGNOSIS — R079 Chest pain, unspecified: Secondary | ICD-10-CM | POA: Insufficient documentation

## 2015-06-12 LAB — COMPREHENSIVE METABOLIC PANEL
ALT: 11 U/L (ref 0–55)
AST (SGOT): 19 U/L (ref 5–34)
Albumin/Globulin Ratio: 1.2 (ref 0.9–2.2)
Albumin: 3.6 g/dL (ref 3.5–5.0)
Alkaline Phosphatase: 120 U/L — ABNORMAL HIGH (ref 37–106)
Anion Gap: 9 (ref 5.0–15.0)
BUN: 17.4 mg/dL (ref 7.0–19.0)
Bilirubin, Total: 0.2 mg/dL (ref 0.2–1.2)
CO2: 24 mEq/L (ref 22–29)
Calcium: 8.8 mg/dL (ref 8.5–10.5)
Chloride: 107 mEq/L (ref 100–111)
Creatinine: 0.8 mg/dL (ref 0.6–1.0)
Globulin: 3.1 g/dL (ref 2.0–3.6)
Glucose: 82 mg/dL (ref 70–100)
Potassium: 4.2 mEq/L (ref 3.5–5.1)
Protein, Total: 6.7 g/dL (ref 6.0–8.3)
Sodium: 140 mEq/L (ref 136–145)

## 2015-06-12 LAB — CBC AND DIFFERENTIAL
Basophils Absolute Automated: 0.02 10*3/uL (ref 0.00–0.20)
Basophils Automated: 0 %
Eosinophils Absolute Automated: 0.13 10*3/uL (ref 0.00–0.70)
Eosinophils Automated: 2 %
Hematocrit: 39 % (ref 37.0–47.0)
Hgb: 12.9 g/dL (ref 12.0–16.0)
Lymphocytes Absolute Automated: 1.84 10*3/uL (ref 0.50–4.40)
Lymphocytes Automated: 21 %
MCH: 30.6 pg (ref 28.0–32.0)
MCHC: 33.1 g/dL (ref 32.0–36.0)
MCV: 92.6 fL (ref 80.0–100.0)
MPV: 10.8 fL (ref 9.4–12.3)
Monocytes Absolute Automated: 0.67 10*3/uL (ref 0.00–1.20)
Monocytes: 8 %
Neutrophils Absolute: 6.1 10*3/uL (ref 1.80–8.10)
Neutrophils: 70 %
Platelets: 259 10*3/uL (ref 140–400)
RBC: 4.21 10*6/uL (ref 4.20–5.40)
RDW: 12 % (ref 12–15)
WBC: 8.76 10*3/uL (ref 3.50–10.80)

## 2015-06-12 LAB — I-STAT TROPONIN: i-STAT Troponin: 0 ng/mL (ref 0.00–0.09)

## 2015-06-12 LAB — PT AND APTT
PT INR: 1.1 (ref 0.9–1.1)
PT: 14.1 s (ref 12.6–15.0)
PTT: 27 s (ref 23–37)

## 2015-06-12 LAB — LIPASE: Lipase: 12 U/L (ref 8–78)

## 2015-06-12 LAB — IHS D-DIMER: D-Dimer: 0.4 ug/mL FEU (ref 0.00–0.51)

## 2015-06-12 LAB — GFR: EGFR: >60

## 2015-06-12 MED ORDER — KETOROLAC TROMETHAMINE 30 MG/ML IJ SOLN
30.0000 mg | Freq: Once | INTRAMUSCULAR | Status: AC
Start: 2015-06-12 — End: 2015-06-12
  Administered 2015-06-12: 30 mg via INTRAVENOUS
  Filled 2015-06-12: qty 1

## 2015-06-12 MED ORDER — ASPIRIN 81 MG PO CHEW
81.0000 mg | CHEWABLE_TABLET | Freq: Once | ORAL | Status: AC
Start: 2015-06-12 — End: 2015-06-12
  Administered 2015-06-12: 81 mg via ORAL
  Filled 2015-06-12: qty 1

## 2015-06-12 MED ORDER — LIDOCAINE VISCOUS 2 % MT SOLN
10.0000 mL | Freq: Once | OROMUCOSAL | Status: AC
Start: 2015-06-12 — End: 2015-06-12
  Administered 2015-06-12: 10 mL via OROMUCOSAL
  Filled 2015-06-12: qty 15

## 2015-06-12 MED ORDER — ALUM & MAG HYDROXIDE-SIMETH 200-200-20 MG/5ML PO SUSP
30.0000 mL | Freq: Once | ORAL | Status: AC
Start: 2015-06-12 — End: 2015-06-12
  Administered 2015-06-12: 30 mL via ORAL
  Filled 2015-06-12: qty 30

## 2015-06-12 NOTE — ED Notes (Signed)
Family at bedside. 

## 2015-06-12 NOTE — ED Notes (Signed)
Pt c/o; left sided chest pain, onset 1400 hours.  Pt reports at onset her felt chest pressure with chest pain.  Pt reports the feeling of chest pressure resolved after approx. 30 mins.

## 2015-06-12 NOTE — Discharge Instructions (Signed)
Dear Ms. Dillen:    Thank you for choosing one of [facility] emergency departments.  I hope your visit today was EXCELLENT.        As we discussed I recommend that you be admitted for further evaluation of your chest pain as we have not ruled out potential heart attack or pending heart attack which may be life threatening. You have decided to go home aware of this and follow-up with your doctor. You should call 911 if worse in any way(pain, breathing problems, nausea, vomiting etc)      Call dr Guy Franco until 6 AM with any questions [phone]    consiider trial of ibuprofen and/or zantac(over-the-counter)    Ibuprofen, Over The Counter    For pain and inflammation, take ibuprofen (Motrin or Advil) 200mg  tabs. Take 4 tablets every 8 hours as needed for pain and/or inflammation.                See your doctor in the morning                If you do not continue to improve or your condition worsens, please contact your doctor or return immediately to the Emergency Department.    Sincerely,  Quintin Alto Tenny Craw, MD  Attending Emergency Physician  [facility] Emergency Department    OBTAINING A PRIMARY CARE APPOINTMENT    Primary care physicians (PCPs, also known as primary care doctors) are either internists or family medicine doctors. Both types of PCPs focus on health promotion, disease prevention, patient education and counseling, and treatment of acute and chronic medical conditions.    Call for an appointment with a primary care doctor.  Ask to see who is taking new patients.     [facility]  telephone:  [phone]  [email or web address]    For a pediatrician, call the [facility] referral line below.  You can also call to make an appointment at [facility] (except [employer] and [facility]):    [address]  [place], [place] [address]  [phone]    [facility]  Call [phone] (available 24 hours a day, 7 days a week) if you need  any further referrals and we can help you find a primary care doctor or specialist.  Also, available online at:  [email or web address]    For more information regarding our services at [facility], please call the number above or visit the website [email or web address]    YOUR CONTACT INFORMATION  Before leaving please check with registration to make sure we have an up-to-date contact number.  You can call registration at [phone], [facility] location) or [phone], [facility] location) to update your information.  For questions about your hospital bill, please call [phone].  For questions about your Emergency Dept Physician bill please call [phone].      FREE HEALTH SERVICES  If you need help with health or social services, please call 2-1-1 for a free referral to resources in your area.  2-1-1 is a free service connecting people with information on health insurance, free clinics, pregnancy, mental health, dental care, food assistance, housing, and substance abuse counseling.  Also, available online at:  http://www.[facility].org    MEDICAL RECORDS AND TESTS  Certain laboratory test results do not come back the same day, for example urine cultures.   We will contact you if other important findings are noted.  Radiology films are often  reviewed again to ensure accuracy.  If there is any discrepancy, we will notify you.      Please call [phone] [facility] location) or [phone] ([place] location) to pick up a complimentary CD of any radiology studies performed.  If you or your doctor would like to request a copy of your medical records, please call [phone].      ORTHOPEDIC INJURY   Please know that significant injuries can exist even when an initial x-ray is read as normal or negative.  This can occur because some fractures (broken bones) are not initially visible on x-rays.  For this reason, close outpatient follow-up  with your primary care doctor or bone specialist (orthopedist) is required.    MEDICATIONS AND FOLLOWUP  Please be aware that some prescription medications can cause drowsiness.  Use caution when driving or operating machinery.    The examination and treatment you have received in our Emergency Department is provided on an emergency basis, and is not intended to be a substitute for your primary care physician.  It is important that your doctor checks you again and that you report any new or remaining problems at that time.      24 HOUR PHARMACIES  Two nearby 24 hour pharmacies are:    [facility]  [address]  [place], [place] [address]  [phone]    [facility]  [address]  [place], [place] [address]  [phone]      ASSISTANCE WITH INSURANCE    Affordable Care Act  [facility])  Call to start or finish an application, compare plans, enroll or ask a question.  [phone]  TTY: [phone]  Web:  Healthcare.gov    Help Enrolling in [facility]  [facility] [place]  [phone] (TOLL-FREE)  [phone] (TTY)  Web:  Http://www.coverva.org    Local Help Enrolling in the [facility]  [place] [facility]  [phone] (MAIN)  Email:  health-help@nvfs .org  Web:  BlackjackMyths.is  Address:  [address] [place], [place] [address]    SEDATING MEDICATIONS  Sedating medications include strong pain medications (e.g. narcotics), muscle relaxers, benzodiazepines (used for anxiety and as muscle relaxers), Benadryl/diphenhydramine and other antihistamines for allergic reactions/itching, and other medications.  If you are unsure if you have received a sedating medication, please ask your physician or nurse.  If you received a sedating medication: DO NOT drive a car. DO NOT operate machinery. DO NOT perform jobs where you need to be alert.  DO NOT drink alcoholic beverages while taking this medicine.     If you get dizzy, sit or lie down at the first signs. Be careful going up and down stairs.   Be extra careful to prevent falls.     Never give this medicine to others.     Keep this medicine out of reach of children.     Do not take or save old medicines. Throw them away when outdated.     Keep all medicines in a cool, dry place. DO NOT keep them in your bathroom medicine cabinet or in a cabinet above the stove.    MEDICATION REFILLS  Please be aware that we cannot refill any prescriptions through the ER. If you need further treatment from what is provided at your ER visit, please follow up with your primary care doctor or your pain management specialist.    Chest Pain of Unclear Etiology    You have been seen for chest pain. The cause of your pain  is not yet known.    Your doctor has learned about your medical history, examined you, and checked any tests that were done. Still, it is unclear why you are having pain. The doctor thinks there is only a very small chance that your pain is caused by a life-threatening condition. Later, your primary care doctor might do more tests or check you again.    Sometimes chest pain is caused by a dangerous condition, like a heart attack, aorta injury, blood clot in the lung, or collapsed lung. It is unlikely that your pain is caused by a life-threatening condition if: Your chest pain lasts only a few seconds at a time; you are not short of breath, nauseated (sick to your stomach), sweaty, or lightheaded; your pain gets worse when you twist or bend; your pain improves with exercise or hard work.    Chest pain is serious. It is VERY IMPORTANT that you follow up with your regular doctor and seek medical attention immediately here or at the nearest Emergency Department if your symptoms become worse or they change.    YOU SHOULD SEEK MEDICAL ATTENTION IMMEDIATELY, EITHER HERE OR AT THE NEAREST EMERGENCY DEPARTMENT, IF ANY OF THE FOLLOWING OCCURS:   Your pain gets worse.   Your pain makes you short of breath, nauseated, or sweaty.   Your pain gets worse when you walk,  go up stairs, or exert yourself.   You feel weak, lightheaded, or faint.   It hurts to breathe.   Your leg swells.   Your symptoms get worse or you have new symptoms or concerns.

## 2015-06-12 NOTE — ED Provider Notes (Addendum)
[facility]         CLINICAL SUMMARY          Diagnosis:    .     Final diagnoses:   Chest pain, unspecified type         MDM Notes:      Nicole Mcknight is a 50 y.o. female with CP and normal EKG,labs and CXR pending at1900 sign out to [facility].      Disposition:      ED Disposition     Discharge Nicole Mcknight discharge to home/self care.    Condition at disposition: Stable            Discharge Medication List as of 06/12/2015  9:51 PM                    CLINICAL INFORMATION        HPI:      Chief Complaint: Chest Pain  .    Nicole Mcknight is a 50 y.o. female who  has a past medical history of Hypertensive disorder and Heart palpitations. and  has no past surgical history on file. who presents with 4 hrs left sided CP pressure initially now sharp,mild SOB earlier,pain worse with movement and deep breath. +CRF : HTN    History obtained from: Patient      ROS:      Positive and negative ROS elements as per HPI.  All other systems reviewed and negative.      Physical Exam:      Pulse 80  BP 186/87 mmHg  Resp 20  SpO2 97 %  Temp 98.6 F (37 C)    Constitutional: Vital signs reviewed. Well appearing.  Head: Normocephalic, atraumatic  Eyes: No conjunctival injection. No discharge.EOMI  ENT: Mucous membranes moist,Ears and throat wnl  Neck: Normal range of motion. Non-tender.  Respiratory/Chest: Clear to auscultation. No respiratory distress.   Cardiovascular: Regular rate and rhythm. No murmur.   Abdomen: Soft and non-tender. No guarding. No masses or hepatosplenomegaly.  Genitourinary:   UpperExtremity:FROM,no abnormality noted  LowerExtremity: No edema. No cyanosis.FROM  Neurological: No focal motor deficits by observation. Speech normal. Memory normal.  Skin: Warm and dry. No rash.  Lymphatic: No cervical lymphadenopathy.  Psychiatric: Normal affect. Normal concentration.  Back  No CVAT,no abnormalities,  Full range of motion              PAST HISTORY         Primary Care Provider: Ludger Nutting, MD        PMH/PSH:    .     Past Medical History   Diagnosis Date   . Hypertensive disorder    . Heart palpitations      been followed in past       She has no past surgical history on file.      Social/Family History:      She reports that she has never smoked. She does not have any smokeless tobacco history on file. She reports that she drinks alcohol. She reports that she does not use illicit drugs.    Family History   Problem Relation Age of Onset   . Hypertension Mother    . Diabetes Father    . Cancer Brother      lung ca   . Pulmonary embolism Brother          Listed Medications on Arrival:    .     Discharge  Medication List as of 06/12/2015  9:51 PM      CONTINUE these medications which have NOT CHANGED    Details   aspirin EC 81 MG EC tablet Take 81 mg by mouth daily., Until Discontinued, Historical Med      Cholecalciferol (VITAMIN D) 2000 UNITS tablet Take 2,000 Units by mouth daily., Until Discontinued, Historical Med      lisinopril-hydrochlorothiazide (PRINZIDE,ZESTORETIC) 20-12.5 MG per tablet TAKE ONE TABLET BY MOUTH ONCE DAILY, Normal      Multiple Vitamins-Minerals (MULTIVITAMIN PO) Take by mouth., Until Discontinued, Historical Med            Allergies: She has No Known Allergies.            VISIT INFORMATION        Clinical Course in the ED:    1900 sign out to Dr.Kopack pending labs        Medications Given in the ED:    .     ED Medication Orders     Start Ordered     Status Ordering Provider    06/12/15 2119 06/12/15 2118  lidocaine viscous (XYLOCAINE) 2 % mouth solution 10 mL   Once     Route: Mouth/Throat  Ordered Dose: 10 mL     Last MAR action:  Given KOPACK, GREGORY E    06/12/15 2119 06/12/15 2118  alum & mag hydroxide-simethicone (MAALOX PLUS) 200-200-20 mg/5 mL suspension 30 mL   Once     Route: Oral  Ordered Dose: 30 mL     Last MAR action:  Given KOPACK, GREGORY E    06/12/15 2035 06/12/15 2034  ketorolac (TORADOL) injection 30 mg   Once      Route: Intravenous  Ordered Dose: 30 mg     Last MAR action:  Given KOPACK, GREGORY E    06/12/15 1843 06/12/15 1842  aspirin chewable tablet 81 mg   Once     Route: Oral  Ordered Dose: 81 mg     Last MAR action:  Given Daphyne Miguez A            Procedures:      Procedures      Interpretations:      O2 sat-           saturation: 97 %; Oxygen use: room air; Interpretation: Normal    Radiology -     interpreted by me with the following observations:   EKG -             interpreted by me: normal sinus at 63.  No ST abnormalities, normal intervals and possible old anteroseptal MI  Monitor -         interpreted by me: normal sinus at 64.               RESULTS        Recent Lab Results:      Results     Procedure Component Value Units Date/Time    Lipase [[id number]] Collected:  06/12/15 1845    Specimen Information:  Blood Updated:  06/12/15 2037     Lipase 12 U/L     Comprehensive metabolic panel [[id number]]  (Abnormal) Collected:  06/12/15 1845    Specimen Information:  Blood Updated:  06/12/15 1910     Glucose 82 mg/dL      BUN 78.2 mg/dL      Creatinine 0.8 mg/dL      Sodium 956 mEq/L  Potassium 4.2 mEq/L      Chloride 107 mEq/L      CO2 24 mEq/L      Calcium 8.8 mg/dL      Protein, Total 6.7 g/dL      Albumin 3.6 g/dL      AST (SGOT) 19 U/L      ALT 11 U/L      Alkaline Phosphatase 120 (H) U/L      Bilirubin, Total 0.2 mg/dL      Globulin 3.1 g/dL      Albumin/Globulin Ratio 1.2      Anion Gap 9.0     GFR [[id number]] Collected:  06/12/15 1845     EGFR >60.0 Updated:  06/12/15 1910    PT/APTT [[id number]]  (Abnormal) Collected:  06/12/15 1845     PT 14.1 sec Updated:  06/12/15 1909     PT INR 1.1      PT Anticoag. Given Within 48 hrs. None (A)      PTT 27 sec     D-Dimer [[id number]] Collected:  06/12/15 1845     D-Dimer 0.40 ug/mL FEU Updated:  06/12/15 1904    i-Stat Troponin [[id number]] Collected:  06/12/15 1845     i-STAT Troponin 0.00 ng/mL Updated:  06/12/15 1901    CBC with differential [[id number]] Collected:   06/12/15 1845    Specimen Information:  Blood from Blood Updated:  06/12/15 1854     WBC 8.76 x10 3/uL      Hgb 12.9 g/dL      Hematocrit 43.3 %      Platelets 259 x10 3/uL      RBC 4.21 x10 6/uL      MCV 92.6 fL      MCH 30.6 pg      MCHC 33.1 g/dL      RDW 12 %      MPV 10.8 fL      Neutrophils 70 %      Lymphocytes Automated 21 %      Monocytes 8 %      Eosinophils Automated 2 %      Basophils Automated 0 %      Neutrophils Absolute 6.10 x10 3/uL      Abs Lymph Automated 1.84 x10 3/uL      Abs Mono Automated 0.67 x10 3/uL      Abs Eos Automated 0.13 x10 3/uL      Absolute Baso Automated 0.02 x10 3/uL               Radiology Results:      XR Chest  AP Portable   Final Result         No acute cardiopulmonary process      J. Carole Binning, MD    06/12/2015 7:26 PM                     Scribe Attestation:      No scribe involved in the care of this patient                             Rob Bunting, MD  06/12/15 Bartholome Bill, MD  06/13/15 [phone]

## 2015-06-13 LAB — ECG 12-LEAD
Atrial Rate: 63 {beats}/min
P Axis: 61 degrees
P-R Interval: 146 ms
Q-T Interval: 420 ms
QRS Duration: 70 ms
QTC Calculation (Bezet): 429 ms
R Axis: 1 degrees
T Axis: 52 degrees
Ventricular Rate: 63 {beats}/min

## 2016-05-21 ENCOUNTER — Encounter (INDEPENDENT_AMBULATORY_CARE_PROVIDER_SITE_OTHER): Payer: Self-pay

## 2016-05-21 ENCOUNTER — Encounter (INDEPENDENT_AMBULATORY_CARE_PROVIDER_SITE_OTHER): Payer: Self-pay | Admitting: Internal Medicine

## 2016-05-21 ENCOUNTER — Ambulatory Visit (INDEPENDENT_AMBULATORY_CARE_PROVIDER_SITE_OTHER): Payer: Commercial Managed Care - POS | Admitting: Internal Medicine

## 2016-05-21 VITALS — BP 141/89 | HR 72 | Temp 98.2°F | Ht 67.0 in | Wt 229.6 lb

## 2016-05-21 DIAGNOSIS — M791 Myalgia, unspecified site: Secondary | ICD-10-CM | POA: Insufficient documentation

## 2016-05-21 DIAGNOSIS — E559 Vitamin D deficiency, unspecified: Secondary | ICD-10-CM

## 2016-05-21 DIAGNOSIS — R768 Other specified abnormal immunological findings in serum: Secondary | ICD-10-CM | POA: Insufficient documentation

## 2016-05-21 DIAGNOSIS — M2550 Pain in unspecified joint: Secondary | ICD-10-CM

## 2016-05-21 NOTE — Progress Notes (Signed)
Initial Rheumatology Consultation    Chief Complaint:     Chief Complaint   Patient presents with   . Initial Consult   . Lupus         HPI:   This patient is a 50 y.o. year old female referred by Ludger Nutting, MD to evaluate +ANA.    She has pain of arms and legs in Nov 2017. Her BP was high. She had physical exam and had labs on 05/12/16, showed +ANA 1:40. Negative RF, Ds-DNA, SSA, SSB, Smith, RNP, SCL-70, Jo-1. Negative ANCA.  ESR 38. Elevated CK 573. Normal CBC, CMP.     Today she feels fine. She has mild pain of knees. She takes advil as needed. She drives school bus.                 PMSH:     Past Medical History:   Diagnosis Date   . Heart palpitations     been followed in past   . Hypertensive disorder        History reviewed. No pertinent surgical history.    Allergies:   No Known Allergies    Meds:     Current Outpatient Prescriptions:   .  aspirin EC 81 MG EC tablet, Take 81 mg by mouth daily., Disp: , Rfl:   .  lisinopril-hydrochlorothiazide (PRINZIDE,ZESTORETIC) 20-12.5 MG per tablet, TAKE ONE TABLET BY MOUTH ONCE DAILY, Disp: 90 tablet, Rfl: 0  .  Cholecalciferol (VITAMIN D) 2000 UNITS tablet, Take 2,000 Units by mouth daily., Disp: , Rfl:   .  Multiple Vitamins-Minerals (MULTIVITAMIN PO), Take by mouth., Disp: , Rfl:     FH:     Family History   Problem Relation Age of Onset   . Hypertension Mother    . Diabetes Father    . Cancer Brother      lung ca   . Pulmonary embolism Brother        SH:     Social History     Social History   . Marital status: Married     Spouse name: N/A   . Number of children: N/A   . Years of education: N/A     Occupational History   . Not on file.     Social History Main Topics   . Smoking status: Never Smoker   . Smokeless tobacco: Never Used   . Alcohol use Yes      Comment: social   . Drug use: No   . Sexual activity: Not on file     Other Topics Concern   . Not on file     Social History Narrative   . No narrative on file       ROS:   All other systems reviewed and  negative except as described above as HPI.       PHYSICAL EXAM:     Vitals:    05/21/16 0951   BP: 141/89   Pulse: 72   Temp: 98.2 F (36.8 C)       General appearance - alert, well appearing, and in no distress  Mental status - alert, oriented to person, place, and time  Eyes - pupils equal and reactive, extraocular eye movements intact  Ears - bilateral TM's and external ear canals normal  Nose - normal and patent, no erythema, discharge or polyps  Mouth - mucous membranes moist, pharynx normal without lesions  Neck - supple, no significant adenopathy  Lymphatics - no palpable lymphadenopathy,  no hepatosplenomegaly  Chest - clear to auscultation, no wheezes, rales or rhonchi, symmetric air entry  Heart - normal rate, regular rhythm, normal S1, S2, no murmurs, rubs, clicks or gallops  Abdomen - soft, nontender, nondistended, no masses or organomegaly  Back exam - full range of motion, no tenderness, palpable spasm or pain on motion  Neurological - alert, oriented, normal speech, no focal findings or movement disorder noted  Musculoskeletal - Good range of cervical spine.  No crepitus on TMJ auscultation.  No tenderness of MCPs and PIPs. Good range of motion of shoulders, elbows, wrists and small joints of hands. Good range of motion of hips, knees and ankles.  Knees crepitus with range of motion of knees without effusions. Ankles without effusions.  No tenderness of MTPs or effusions.    Extremities - peripheral pulses normal, no pedal edema, no clubbing or cyanosis  Skin - normal coloration and turgor, no rashes, no suspicious skin lesions noted        Labs:         Radiology:         ASSESSMENT:   ROYA GIESELMAN is a 50 y.o. female. She has polyarthralgia, myalgia and mild elevated CK and bordering ANA. No symptoms for SLE. About 15% normal population have low titer +ANA.       PLAN:   1. Check CK, aldolase, LDH, Vit D, TSH   2. Suggest exercise and weight loss  3. Vit D 2000 IU daily  4. Follow 4-6 weeks or  early. She will check labs

## 2016-05-21 NOTE — Progress Notes (Signed)
Have you seen any specialists outside [facility]?   NO

## 2016-05-21 NOTE — Patient Instructions (Signed)
1. Check CK, aldolase, LDH, Vit D, TSH   2. Suggest exercise and weight loss  3. Vit D 2000 IU daily  4. Follow 4-6 weeks or early. She will check labs

## 2016-05-22 ENCOUNTER — Other Ambulatory Visit (INDEPENDENT_AMBULATORY_CARE_PROVIDER_SITE_OTHER): Payer: Self-pay | Admitting: Rheumatology

## 2016-05-22 DIAGNOSIS — M791 Myalgia, unspecified site: Secondary | ICD-10-CM

## 2016-05-22 LAB — CK: Creatine Kinase (CK): 481 U/L — ABNORMAL HIGH (ref 29–168)

## 2016-05-22 LAB — HEMOLYSIS INDEX: Hemolysis Index: 4 (ref 0–18)

## 2016-05-22 LAB — LACTATE DEHYDROGENASE: LDH: 269 U/L (ref 125–331)

## 2016-05-22 LAB — VITAMIN D,25 OH,TOTAL: Vitamin D, 25 OH, Total: 17 ng/mL — ABNORMAL LOW (ref 30–100)

## 2016-05-22 LAB — TSH: TSH: 0.84 u[IU]/mL (ref 0.35–4.94)

## 2016-05-23 ENCOUNTER — Telehealth (INDEPENDENT_AMBULATORY_CARE_PROVIDER_SITE_OTHER): Payer: Self-pay | Admitting: Internal Medicine

## 2016-05-23 DIAGNOSIS — E559 Vitamin D deficiency, unspecified: Secondary | ICD-10-CM

## 2016-05-23 MED ORDER — VITAMIN D (ERGOCALCIFEROL) 1.25 MG (50000 UT) PO CAPS
50000.0000 [IU] | ORAL_CAPSULE | ORAL | 1 refills | Status: DC
Start: 2016-05-23 — End: 2017-09-07

## 2016-05-23 NOTE — Telephone Encounter (Signed)
Called pt and left an message. Her CK improving. Start vit d 50,000 iu weekly.

## 2016-06-17 ENCOUNTER — Telehealth (INDEPENDENT_AMBULATORY_CARE_PROVIDER_SITE_OTHER): Payer: Self-pay | Admitting: Internal Medicine

## 2016-06-18 NOTE — Telephone Encounter (Signed)
OCT Vit D. Please call pt. Thanks.

## 2016-06-18 NOTE — Telephone Encounter (Signed)
Left message informing patient.

## 2016-06-25 ENCOUNTER — Ambulatory Visit (INDEPENDENT_AMBULATORY_CARE_PROVIDER_SITE_OTHER): Payer: Self-pay | Admitting: Internal Medicine

## 2016-07-16 ENCOUNTER — Ambulatory Visit (INDEPENDENT_AMBULATORY_CARE_PROVIDER_SITE_OTHER): Payer: Commercial Managed Care - POS | Admitting: Internal Medicine

## 2016-08-04 ENCOUNTER — Ambulatory Visit (INDEPENDENT_AMBULATORY_CARE_PROVIDER_SITE_OTHER): Payer: Commercial Managed Care - POS | Admitting: Internal Medicine

## 2016-12-17 ENCOUNTER — Telehealth (INDEPENDENT_AMBULATORY_CARE_PROVIDER_SITE_OTHER): Payer: Self-pay | Admitting: Physician Assistant

## 2016-12-17 NOTE — Telephone Encounter (Signed)
Pt calling to request copies of office notes from 2013-2014 visits with Lauren Meth, also any notes regarding her holter monitor readings. Pt would like to pick up copies in the office. Pt can be reached at [phone].

## 2016-12-18 NOTE — Telephone Encounter (Signed)
Please see message regarding medical records

## 2017-04-24 ENCOUNTER — Other Ambulatory Visit (INDEPENDENT_AMBULATORY_CARE_PROVIDER_SITE_OTHER): Payer: Self-pay | Admitting: Primary Care

## 2017-04-24 ENCOUNTER — Ambulatory Visit (INDEPENDENT_AMBULATORY_CARE_PROVIDER_SITE_OTHER): Payer: Self-pay

## 2017-04-24 DIAGNOSIS — R059 Cough, unspecified: Secondary | ICD-10-CM

## 2017-05-08 ENCOUNTER — Other Ambulatory Visit: Payer: Self-pay | Admitting: Obstetrics & Gynecology

## 2017-05-08 DIAGNOSIS — Z01419 Encounter for gynecological examination (general) (routine) without abnormal findings: Secondary | ICD-10-CM

## 2017-05-14 ENCOUNTER — Ambulatory Visit: Payer: Commercial Managed Care - POS | Attending: Obstetrics & Gynecology

## 2017-05-14 DIAGNOSIS — Z01419 Encounter for gynecological examination (general) (routine) without abnormal findings: Secondary | ICD-10-CM | POA: Insufficient documentation

## 2017-05-21 ENCOUNTER — Ambulatory Visit: Admission: RE | Admit: 2017-05-21 | Payer: Self-pay | Source: Ambulatory Visit

## 2017-05-21 ENCOUNTER — Ambulatory Visit
Admission: RE | Admit: 2017-05-21 | Discharge: 2017-05-21 | Disposition: A | Discharge status: Home / Self Care | Payer: Self-pay | Source: Ambulatory Visit | Attending: Obstetrics & Gynecology | Admitting: Obstetrics & Gynecology

## 2017-05-21 ENCOUNTER — Other Ambulatory Visit (INDEPENDENT_AMBULATORY_CARE_PROVIDER_SITE_OTHER): Payer: Self-pay | Admitting: Physician Assistant

## 2017-05-21 ENCOUNTER — Ambulatory Visit
Admission: RE | Admit: 2017-05-21 | Discharge: 2017-05-21 | Disposition: A | Discharge status: Home / Self Care | Payer: Self-pay | Source: Ambulatory Visit | Attending: Physician Assistant | Admitting: Physician Assistant

## 2017-05-21 ENCOUNTER — Other Ambulatory Visit: Payer: Self-pay | Admitting: Obstetrics & Gynecology

## 2017-05-21 DIAGNOSIS — R928 Other abnormal and inconclusive findings on diagnostic imaging of breast: Secondary | ICD-10-CM

## 2017-06-19 ENCOUNTER — Encounter (INDEPENDENT_AMBULATORY_CARE_PROVIDER_SITE_OTHER): Payer: Self-pay | Admitting: Internal Medicine

## 2017-06-19 ENCOUNTER — Ambulatory Visit (INDEPENDENT_AMBULATORY_CARE_PROVIDER_SITE_OTHER): Payer: Commercial Managed Care - POS | Admitting: Internal Medicine

## 2017-06-19 VITALS — Ht 67.01 in | Wt 229.0 lb

## 2017-06-19 DIAGNOSIS — M353 Polymyalgia rheumatica: Secondary | ICD-10-CM | POA: Insufficient documentation

## 2017-06-19 DIAGNOSIS — M791 Myalgia, unspecified site: Secondary | ICD-10-CM

## 2017-06-19 DIAGNOSIS — R768 Other specified abnormal immunological findings in serum: Secondary | ICD-10-CM

## 2017-06-19 DIAGNOSIS — E0590 Thyrotoxicosis, unspecified without thyrotoxic crisis or storm: Secondary | ICD-10-CM | POA: Insufficient documentation

## 2017-06-19 DIAGNOSIS — M2550 Pain in unspecified joint: Secondary | ICD-10-CM

## 2017-06-19 DIAGNOSIS — R29898 Other symptoms and signs involving the musculoskeletal system: Secondary | ICD-10-CM | POA: Insufficient documentation

## 2017-06-19 DIAGNOSIS — E559 Vitamin D deficiency, unspecified: Secondary | ICD-10-CM

## 2017-06-19 MED ORDER — PREDNISONE 5 MG PO TABS
ORAL_TABLET | ORAL | 1 refills | Status: DC
Start: 2017-06-19 — End: 2017-09-07

## 2017-06-19 MED ORDER — OMEPRAZOLE 40 MG PO CPDR
40.00 mg | DELAYED_RELEASE_CAPSULE | Freq: Every day | ORAL | 2 refills | Status: DC
Start: 2017-06-19 — End: 2017-09-07

## 2017-06-19 NOTE — Progress Notes (Signed)
Has the patient sought any care outside of the [facility]? NO    -Refer to care team-   Or  List Specialists:

## 2017-06-19 NOTE — Patient Instructions (Addendum)
1. Start prednisone 5 mg, 4 tabs/d x 2-4 weeks, 3 tabs/d x 2-4 weeks, 2 tabs/d x 2-4 weeks   2. Suggest exercise and weight loss  3. Suggest calcium 1000 mg/d and Vit D 2000 IU daily  4. Refer to PT  5. Follow 4-6 weeks or early.   6. Follow Endo   7. Start omeprazole 40 mg/d

## 2017-06-19 NOTE — Progress Notes (Signed)
Initial Rheumatology Consultation    Chief Complaint:     Chief Complaint   Patient presents with   . Follow-up     Last seen in 2017 / Muscle weakness          HPI:   This patient is a 52 y.o. year old female referred by Ludger Nutting, MD to evaluate +ANA.    Recently she has weakness of legs for a week. She has pain of shoulders, hips, thigh and knees. She has difficulty walking. No headache, jaw pain or vision changes.  Labs on 06/03/17 showed elevated ESR 51. CK 226. Negative RF, CCP. TSH 0.01. Normal CMP.   She saw Endo and was given atenolol. She had labs yesterday.     She had physical exam and had labs on 05/12/16, showed +ANA 1:40. Negative RF, Ds-DNA, SSA, SSB, Smith, RNP, SCL-70, Jo-1. Negative ANCA.  ESR 38. Elevated CK 573. Normal CBC, CMP.     She drives school bus.                 PMSH:     Past Medical History:   Diagnosis Date   . Heart palpitations     been followed in past   . Hypertensive disorder        History reviewed. No pertinent surgical history.    Allergies:   No Known Allergies    Meds:     Current Outpatient Prescriptions:   .  aspirin EC 81 MG EC tablet, Take 81 mg by mouth daily., Disp: , Rfl:   .  ATENOLOL PO, Take by mouth., Disp: , Rfl:   .  Cholecalciferol (VITAMIN D) 2000 UNITS tablet, Take 2,000 Units by mouth daily., Disp: , Rfl:   .  Multiple Vitamins-Minerals (MULTIVITAMIN PO), Take by mouth., Disp: , Rfl:   .  vitamin D, ergocalciferol, (DRISDOL) 50000 UNIT Cap, Take 1 capsule (50,000 Units total) by mouth once a week., Disp: 12 capsule, Rfl: 1    FH:     Family History   Problem Relation Age of Onset   . Hypertension Mother    . Diabetes Father    . Cancer Brother         lung ca   . Pulmonary embolism Brother        SH:     Social History     Social History   . Marital status: Married     Spouse name: N/A   . Number of children: N/A   . Years of education: N/A     Occupational History   . Not on file.     Social History Main Topics   . Smoking status: Never Smoker   .  Smokeless tobacco: Never Used   . Alcohol use Yes      Comment: social   . Drug use: No   . Sexual activity: Not on file     Other Topics Concern   . Not on file     Social History Narrative   . No narrative on file       ROS:   All other systems reviewed and negative except as described above as HPI.       PHYSICAL EXAM:     There were no vitals filed for this visit.    General appearance - alert, well appearing, and in no distress  Mental status - alert, oriented to person, place, and time  Eyes - pupils equal and reactive, extraocular eye movements  intact  Ears - bilateral TM's and external ear canals normal  Nose - normal and patent, no erythema, discharge or polyps  Mouth - mucous membranes moist, pharynx normal without lesions  Neck - supple, no significant adenopathy  Lymphatics - no palpable lymphadenopathy, no hepatosplenomegaly  Chest - clear to auscultation, no wheezes, rales or rhonchi, symmetric air entry  Heart - normal rate, regular rhythm, normal S1, S2, no murmurs, rubs, clicks or gallops  Abdomen - soft, nontender, nondistended, no masses or organomegaly  Back exam - full range of motion, no tenderness, palpable spasm or pain on motion  Neurological - alert, oriented, normal speech, no focal findings or movement disorder noted  Musculoskeletal - Good range of cervical spine.  No crepitus on TMJ auscultation.  No tenderness of MCPs and PIPs. Pain with ROM of shoulders and hips. Good range of motion of elbows, wrists and small joints of hands. Good range of motion of  knees and ankles.  Knees crepitus with range of motion of knees without effusions. Ankles without effusions.  No tenderness of MTPs or effusions.    Extremities - peripheral pulses normal, no pedal edema, no clubbing or cyanosis  Skin - normal coloration and turgor, no rashes, no suspicious skin lesions noted        Labs:         Radiology:         ASSESSMENT:   Nicole Mcknight is a 52 y.o. female.     She has poly inflammatory  arthritis and hyperthyroidism.    bordering ANA. No symptoms for SLE. About 15% normal population have low titer +ANA. Hyperthyroidism can have +ANA.     She has difficulty walking and muscle weakness, which can be due to  Hyperthyroidism.       PLAN:   1. Start prednisone 5 mg, 4 tabs/d x 2-4 weeks, 3 tabs/d x 2-4 weeks, 2 tabs/d x 2-4 weeks   2. Suggest exercise and weight loss  3. Suggest calcium 1000 mg/d and Vit D 2000 IU daily  4. Refer to PT  5. Follow 4-6 weeks or early.   6. Follow Endo   7. Start omeprazole 40 mg/d

## 2017-06-24 ENCOUNTER — Telehealth (INDEPENDENT_AMBULATORY_CARE_PROVIDER_SITE_OTHER): Payer: Self-pay | Admitting: Internal Medicine

## 2017-06-24 NOTE — Telephone Encounter (Signed)
Talked to pt. She takes prednione 20 mg/d and feels better. She has heart palpitation and follows with her cardiology. She is on b-blocker. Suggest prednisone 10 mg/d. Please call if any question.

## 2017-06-24 NOTE — Telephone Encounter (Signed)
Patient feels irregular heartbeats when they take predniSONE (DELTASONE) 5 MG tablet and feel that they are having some sort of reaction to it her callback number is [phone].

## 2017-07-17 ENCOUNTER — Encounter (INDEPENDENT_AMBULATORY_CARE_PROVIDER_SITE_OTHER): Payer: Self-pay

## 2017-07-17 ENCOUNTER — Ambulatory Visit (INDEPENDENT_AMBULATORY_CARE_PROVIDER_SITE_OTHER): Payer: Commercial Managed Care - POS | Admitting: Internal Medicine

## 2017-07-17 ENCOUNTER — Encounter (INDEPENDENT_AMBULATORY_CARE_PROVIDER_SITE_OTHER): Payer: Self-pay | Admitting: Internal Medicine

## 2017-07-17 VITALS — BP 140/85 | HR 68 | Ht 66.14 in | Wt 226.6 lb

## 2017-07-17 DIAGNOSIS — M2550 Pain in unspecified joint: Secondary | ICD-10-CM

## 2017-07-17 DIAGNOSIS — E559 Vitamin D deficiency, unspecified: Secondary | ICD-10-CM

## 2017-07-17 DIAGNOSIS — M791 Myalgia, unspecified site: Secondary | ICD-10-CM

## 2017-07-17 DIAGNOSIS — M353 Polymyalgia rheumatica: Secondary | ICD-10-CM

## 2017-07-17 DIAGNOSIS — R29898 Other symptoms and signs involving the musculoskeletal system: Secondary | ICD-10-CM

## 2017-07-17 NOTE — Progress Notes (Signed)
Initial Rheumatology Consultation    Chief Complaint:     Chief Complaint   Patient presents with   . Follow-up     polyarthralgia         HPI:   This patient is a 52 y.o. year old female with poly inflammatory arthritis and hyperthyroidism referred by Ludger Nutting, MD to evaluate +ANA.    She started prednisone 20mg  and had heart palpitation. She decreased prednisone 10 mg/d. Her weakness of legs and pain of shoulders, hips, thigh and knees resolved. She saw endo Dr. Hartford Poli and was started tapazole. She developed itches without rash. She increased prednisone 20 mg/d since yesterday.       . She has difficulty walking. No headache, jaw pain or vision changes.  Labs on 06/03/17 showed elevated ESR 51. CK 226. Negative RF, CCP. TSH 0.01. Normal CMP.   She saw Endo and was given atenolol. She had labs yesterday.     She had physical exam and had labs on 05/12/16, showed +ANA 1:40. Negative RF, Ds-DNA, SSA, SSB, Smith, RNP, SCL-70, Jo-1. Negative ANCA.  ESR 38. Elevated CK 573. Normal CBC, CMP.     She drives school bus.                 PMSH:     Past Medical History:   Diagnosis Date   . Heart palpitations     been followed in past   . Hypertensive disorder        History reviewed. No pertinent surgical history.    Allergies:   No Known Allergies    Meds:     Current Outpatient Prescriptions:   .  atenolol (TENORMIN) 25 MG tablet, , Disp: , Rfl:   .  Cholecalciferol (VITAMIN D) 2000 UNITS tablet, Take 2,000 Units by mouth daily., Disp: , Rfl:   .  methIMAzole (TAPAZOLE) 10 MG tablet, , Disp: , Rfl:   .  Multiple Vitamins-Minerals (MULTIVITAMIN PO), Take by mouth., Disp: , Rfl:   .  omeprazole (PRILOSEC) 40 MG capsule, Take 1 capsule (40 mg total) by mouth daily., Disp: 30 capsule, Rfl: 2  .  predniSONE (DELTASONE) 5 MG tablet, 4 tabs daily x 4 weeks, 3 tabs daily x 4 weeks, 2 tabs daily x 4 weeks, 1 tab daily x 4 weeks. (Patient taking differently: 20 mg daily.4 tabs daily x 4 weeks, 3 tabs daily x 4  weeks, 2 tabs daily x 4 weeks, 1 tab daily x 4 weeks.  ), Disp: 200 tablet, Rfl: 1  .  aspirin EC 81 MG EC tablet, Take 81 mg by mouth daily., Disp: , Rfl:   .  vitamin D, ergocalciferol, (DRISDOL) 50000 UNIT Cap, Take 1 capsule (50,000 Units total) by mouth once a week., Disp: 12 capsule, Rfl: 1    FH:     Family History   Problem Relation Age of Onset   . Hypertension Mother    . Diabetes Father    . Cancer Brother         lung ca   . Pulmonary embolism Brother        SH:     Social History     Social History   . Marital status: Married     Spouse name: N/A   . Number of children: N/A   . Years of education: N/A     Occupational History   . Not on file.     Social History Main Topics   .  Smoking status: Never Smoker   . Smokeless tobacco: Never Used   . Alcohol use Yes      Comment: social   . Drug use: No   . Sexual activity: Not on file     Other Topics Concern   . Not on file     Social History Narrative   . No narrative on file       ROS:   All other systems reviewed and negative except as described above as HPI.       PHYSICAL EXAM:     Vitals:    07/17/17 0925   BP: 140/85   Pulse: 68       General appearance - alert, well appearing, and in no distress  Mental status - alert, oriented to person, place, and time  Eyes - pupils equal and reactive, extraocular eye movements intact  Ears - bilateral TM's and external ear canals normal  Nose - normal and patent, no erythema, discharge or polyps  Mouth - mucous membranes moist, pharynx normal without lesions  Neck - supple, no significant adenopathy  Lymphatics - no palpable lymphadenopathy, no hepatosplenomegaly  Chest - clear to auscultation, no wheezes, rales or rhonchi, symmetric air entry  Heart - normal rate, regular rhythm, normal S1, S2, no murmurs, rubs, clicks or gallops  Abdomen - soft, nontender, nondistended, no masses or organomegaly  Back exam - full range of motion, no tenderness, palpable spasm or pain on motion  Neurological - alert, oriented,  normal speech, no focal findings or movement disorder noted  Musculoskeletal - Good range of cervical spine.  No crepitus on TMJ auscultation.  No tenderness of MCPs and PIPs. Good ROM of shoulders and hips. Good range of motion of elbows, wrists and small joints of hands. Good range of motion of  knees and ankles.  Knees crepitus with range of motion of knees without effusions. Ankles without effusions.  No tenderness of MTPs or effusions.    Extremities - peripheral pulses normal, no pedal edema, no clubbing or cyanosis  Skin - normal coloration and turgor, no rashes, no suspicious skin lesions noted        Labs:         Radiology:         ASSESSMENT:   Nicole Mcknight is a 52 y.o. female with poly inflammatory arthritis and hyperthyroidism.    bordering ANA. No symptoms for SLE. About 15% normal population have low titer +ANA. Hyperthyroidism can have +ANA.     She has difficulty walking and muscle weakness, which can be due to  Hyperthyroidism. She is better.       PLAN:   1. Suggest prednisone 5 mg, 4 tabs/d x 1 week, 3 tabs/d x 1 week, 2 tabs/d x 1 week, 1 tab/d x 1 week   2. Suggest exercise and weight loss  3. Suggest calcium 1000 mg/d and Vit D 2000 IU daily  4. Continue omeprazole 40 mg/d   5. Follow 6 weeks or early.

## 2017-09-03 ENCOUNTER — Other Ambulatory Visit (INDEPENDENT_AMBULATORY_CARE_PROVIDER_SITE_OTHER): Payer: Self-pay | Admitting: Internal Medicine

## 2017-09-04 LAB — ANA MULTIPLEX WITH REFLEX TO 11 ANTIBODY CASCADE: ANAchoice (R) Screen: NEGATIVE

## 2017-09-04 LAB — ALDOLASE: Aldolase: 6.3 U/L (ref <=8.1)

## 2017-09-04 LAB — SEDIMENTATION RATE: Sed Rate: 25 mm/h (ref <=30)

## 2017-09-04 LAB — LACTATE DEHYDROGENASE: LDH: 222 U/L (ref 120–250)

## 2017-09-04 LAB — CK: Creatinine Kinase, Total: 261 U/L — ABNORMAL HIGH (ref 29–143)

## 2017-09-04 LAB — TSH: TSH: 4.07 mIU/L

## 2017-09-07 ENCOUNTER — Ambulatory Visit (INDEPENDENT_AMBULATORY_CARE_PROVIDER_SITE_OTHER): Payer: Commercial Managed Care - POS | Admitting: Internal Medicine

## 2017-09-07 ENCOUNTER — Encounter (INDEPENDENT_AMBULATORY_CARE_PROVIDER_SITE_OTHER): Payer: Self-pay | Admitting: Internal Medicine

## 2017-09-07 VITALS — BP 139/87 | HR 54 | Ht 66.14 in | Wt 237.0 lb

## 2017-09-07 DIAGNOSIS — R29898 Other symptoms and signs involving the musculoskeletal system: Secondary | ICD-10-CM

## 2017-09-07 DIAGNOSIS — M2550 Pain in unspecified joint: Secondary | ICD-10-CM

## 2017-09-07 DIAGNOSIS — E0590 Thyrotoxicosis, unspecified without thyrotoxic crisis or storm: Secondary | ICD-10-CM

## 2017-09-07 DIAGNOSIS — R768 Other specified abnormal immunological findings in serum: Secondary | ICD-10-CM

## 2017-09-07 DIAGNOSIS — E559 Vitamin D deficiency, unspecified: Secondary | ICD-10-CM

## 2017-09-07 NOTE — Progress Notes (Signed)
Initial Rheumatology Consultation    Chief Complaint:     Joint pain       HPI:   This patient is a 52 y.o. year old female with poly inflammatory arthritis and hyperthyroidism referred by Nicole Nutting, MD to evaluate +ANA.    She finished prednisone and feels better.  Her weakness of legs resolved. She has no joint pain.     She saw endo Dr. Hartford Mcknight and takes tapazole.     She has difficulty walking. No headache, jaw pain or vision changes.  Labs on 06/03/17 showed elevated ESR 51. CK 226. Negative RF, CCP. TSH 0.01. Normal CMP.     She had physical exam and had labs on 05/12/16, showed +ANA 1:40. Negative RF, Ds-DNA, SSA, SSB, Smith, RNP, SCL-70, Jo-1. Negative ANCA.  ESR 38. Elevated CK 573. Normal CBC, CMP.     She drives school bus.                 PMSH:     Past Medical History:   Diagnosis Date   . Heart palpitations     been followed in past   . Hypertensive disorder        History reviewed. No pertinent surgical history.    Allergies:   No Known Allergies    Meds:     Current Outpatient Prescriptions:   .  aspirin EC 81 MG EC tablet, Take 81 mg by mouth daily., Disp: , Rfl:   .  atenolol (TENORMIN) 25 MG tablet, , Disp: , Rfl:   .  Cholecalciferol (VITAMIN D) 2000 UNITS tablet, Take 2,000 Units by mouth daily., Disp: , Rfl:   .  methIMAzole (TAPAZOLE) 10 MG tablet, , Disp: , Rfl:   .  Multiple Vitamins-Minerals (MULTIVITAMIN PO), Take by mouth., Disp: , Rfl:     FH:     Family History   Problem Relation Age of Onset   . Hypertension Mother    . Diabetes Father    . Cancer Brother         lung ca   . Pulmonary embolism Brother        SH:     Social History     Social History   . Marital status: Married     Spouse name: N/A   . Number of children: N/A   . Years of education: N/A     Occupational History   . Not on file.     Social History Main Topics   . Smoking status: Never Smoker   . Smokeless tobacco: Never Used   . Alcohol use Yes      Comment: social   . Drug use: No   . Sexual  activity: Not on file     Other Topics Concern   . Not on file     Social History Narrative   . No narrative on file       ROS:   All other systems reviewed and negative except as described above as HPI.       PHYSICAL EXAM:     Vitals:    09/07/17 0930   BP: 139/87   Pulse: (!) 54       General appearance - alert, well appearing, and in no distress  Mental status - alert, oriented to person, place, and time  Eyes - pupils equal and reactive, extraocular eye movements intact  Ears - bilateral TM's and external ear canals normal  Nose - normal and  patent, no erythema, discharge or polyps  Mouth - mucous membranes moist, pharynx normal without lesions  Neck - supple, no significant adenopathy  Lymphatics - no palpable lymphadenopathy, no hepatosplenomegaly  Chest - clear to auscultation, no wheezes, rales or rhonchi, symmetric air entry  Heart - normal rate, regular rhythm, normal S1, S2, no murmurs, rubs, clicks or gallops  Abdomen - soft, nontender, nondistended, no masses or organomegaly  Back exam - full range of motion, no tenderness, palpable spasm or pain on motion  Neurological - alert, oriented, normal speech, no focal findings or movement disorder noted  Musculoskeletal - Good range of cervical spine.  No crepitus on TMJ auscultation.  No tenderness of MCPs and PIPs. Good ROM of shoulders and hips. Good range of motion of elbows, wrists and small joints of hands. Good range of motion of  knees and ankles.  Knees crepitus with range of motion of knees without effusions. Ankles without effusions.  No tenderness of MTPs or effusions.    Extremities - peripheral pulses normal, no pedal edema, no clubbing or cyanosis  Skin - normal coloration and turgor, no rashes, no suspicious skin lesions noted        Labs:     Component      Latest Ref Rng & Units 09/03/2017   LDH      120 - 250 U/L 222   Creatinine Kinase, Total      29 - 143 U/L 261 (H)   Sed Rate      < OR = 30 mm/h 25   ANAchoice (R) Screen      NEGATIVE  NEGATIVE   TSH      mIU/L 4.07   Aldolase      < OR = 8.1 U/L 6.3       Radiology:         ASSESSMENT:   Nicole Mcknight is a 52 y.o. female with poly inflammatory arthritis and hyperthyroidism.    bordering ANA. No symptoms for SLE. About 15% normal population have low titer +ANA. Hyperthyroidism can have +ANA.     She is stable.       PLAN:   1. Suggest Tylenol as needed   2. Suggest exercise and weight loss  3. Suggest calcium 1000 mg/d and Vit D 2000 IU daily  4. Follow as needed

## 2017-09-07 NOTE — Patient Instructions (Signed)
1. Suggest Tylenol as needed   2. Suggest exercise and weight loss  3. Suggest calcium 1000 mg/d and Vit D 2000 IU daily  4. Follow as needed

## 2017-09-07 NOTE — Progress Notes (Signed)
Has the patient sought any care outside of the [facility]? NO    -Refer to care team-   Or  List Specialists:

## 2017-09-08 ENCOUNTER — Telehealth (INDEPENDENT_AMBULATORY_CARE_PROVIDER_SITE_OTHER): Payer: Self-pay | Admitting: Internal Medicine

## 2017-09-08 NOTE — Telephone Encounter (Signed)
Patient saw Dr. Chipper Herb yesterday and left message that she forgot to ask a question about her lab results. Please call back at [phone]

## 2017-09-10 NOTE — Telephone Encounter (Signed)
Please review labs and I can contact pt if you would like.

## 2017-09-11 ENCOUNTER — Telehealth (INDEPENDENT_AMBULATORY_CARE_PROVIDER_SITE_OTHER): Payer: Self-pay | Admitting: Internal Medicine

## 2017-09-11 NOTE — Telephone Encounter (Signed)
Called pt and could not leave an message. Her mailbox is full. Her labs were unremarkable. Repeated ANA negative. Mild elevated CK, improving. Please call pt.   Thanks.

## 2017-09-17 ENCOUNTER — Encounter (INDEPENDENT_AMBULATORY_CARE_PROVIDER_SITE_OTHER): Payer: Self-pay

## 2017-09-17 NOTE — Telephone Encounter (Signed)
Sent patient message via MyChart with Dr. [name] message since patient was able to view her results and we were not able to reach via listed phone numbers.

## 2018-02-22 ENCOUNTER — Encounter (INDEPENDENT_AMBULATORY_CARE_PROVIDER_SITE_OTHER): Payer: Self-pay | Admitting: Internal Medicine

## 2018-03-03 ENCOUNTER — Emergency Department
Admission: EM | Admit: 2018-03-03 | Discharge: 2018-03-03 | Disposition: A | Discharge status: Home / Self Care | Payer: Commercial Managed Care - POS | Attending: Emergency Medicine | Admitting: Emergency Medicine

## 2018-03-03 ENCOUNTER — Emergency Department: Payer: Commercial Managed Care - POS

## 2018-03-03 DIAGNOSIS — I10 Essential (primary) hypertension: Secondary | ICD-10-CM | POA: Insufficient documentation

## 2018-03-03 DIAGNOSIS — Z7982 Long term (current) use of aspirin: Secondary | ICD-10-CM | POA: Insufficient documentation

## 2018-03-03 DIAGNOSIS — R519 Headache, unspecified: Secondary | ICD-10-CM

## 2018-03-03 DIAGNOSIS — Z79899 Other long term (current) drug therapy: Secondary | ICD-10-CM | POA: Insufficient documentation

## 2018-03-03 DIAGNOSIS — R51 Headache: Secondary | ICD-10-CM | POA: Insufficient documentation

## 2018-03-03 LAB — TSH: TSH: 3.61 u[IU]/mL (ref 0.35–4.94)

## 2018-03-03 LAB — CBC AND DIFFERENTIAL
Absolute NRBC: 0 10*3/uL (ref 0.00–0.00)
Basophils Absolute Automated: 0.02 10*3/uL (ref 0.00–0.08)
Basophils Automated: 0.3 %
Eosinophils Absolute Automated: 0.11 10*3/uL (ref 0.00–0.44)
Eosinophils Automated: 1.5 %
Hematocrit: 42.4 % (ref 34.7–43.7)
Hgb: 13.3 g/dL (ref 11.4–14.8)
Immature Granulocytes Absolute: 0.01 10*3/uL (ref 0.00–0.07)
Immature Granulocytes: 0.1 %
Lymphocytes Absolute Automated: 2.19 10*3/uL (ref 0.42–3.22)
Lymphocytes Automated: 29.3 %
MCH: 29.6 pg (ref 25.1–33.5)
MCHC: 31.4 g/dL — ABNORMAL LOW (ref 31.5–35.8)
MCV: 94.2 fL (ref 78.0–96.0)
MPV: 11.1 fL (ref 8.9–12.5)
Monocytes Absolute Automated: 0.38 10*3/uL (ref 0.21–0.85)
Monocytes: 5.1 %
Neutrophils Absolute: 4.77 10*3/uL (ref 1.10–6.33)
Neutrophils: 63.7 %
Nucleated RBC: 0 /100 WBC (ref 0.0–0.0)
Platelets: 265 10*3/uL (ref 142–346)
RBC: 4.5 10*6/uL (ref 3.90–5.10)
RDW: 13 % (ref 11–15)
WBC: 7.48 10*3/uL (ref 3.10–9.50)

## 2018-03-03 LAB — TROPONIN I: Troponin I: <0.01 ng/mL (ref 0.00–0.09)

## 2018-03-03 LAB — BASIC METABOLIC PANEL
BUN: 11 mg/dL (ref 7.0–19.0)
CO2: 26 mEq/L (ref 22–29)
Calcium: 9.4 mg/dL (ref 8.5–10.5)
Chloride: 103 mEq/L (ref 100–111)
Creatinine: 0.8 mg/dL (ref 0.6–1.0)
Glucose: 78 mg/dL (ref 70–100)
Potassium: 3.7 mEq/L (ref 3.5–5.1)
Sodium: 139 mEq/L (ref 136–145)

## 2018-03-03 LAB — GFR: EGFR: >60

## 2018-03-03 LAB — MAGNESIUM: Magnesium: 2.1 mg/dL (ref 1.6–2.6)

## 2018-03-03 MED ORDER — BUTALBITAL-APAP-CAFFEINE 50-325-40 MG PO TABS
1.00 | ORAL_TABLET | Freq: Four times a day (QID) | ORAL | 0 refills | Status: DC | PRN
Start: 2018-03-03 — End: 2021-08-21

## 2018-03-03 MED ORDER — DIPHENHYDRAMINE HCL 50 MG/ML IJ SOLN
25.00 mg | Freq: Once | INTRAMUSCULAR | Status: AC
Start: 2018-03-03 — End: 2018-03-03
  Administered 2018-03-03: 20:00:00 25 mg via INTRAVENOUS
  Filled 2018-03-03: qty 1

## 2018-03-03 MED ORDER — METOCLOPRAMIDE HCL 5 MG/ML IJ SOLN
10.00 mg | Freq: Once | INTRAMUSCULAR | Status: AC
Start: 2018-03-03 — End: 2018-03-03
  Administered 2018-03-03: 20:00:00 10 mg via INTRAVENOUS
  Filled 2018-03-03: qty 2

## 2018-03-03 MED ORDER — KETOROLAC TROMETHAMINE 30 MG/ML IJ SOLN
30.00 mg | Freq: Once | INTRAMUSCULAR | Status: AC
Start: 2018-03-03 — End: 2018-03-03
  Administered 2018-03-03: 22:00:00 30 mg via INTRAVENOUS
  Filled 2018-03-03: qty 1

## 2018-03-03 MED ORDER — DEXAMETHASONE SODIUM PHOSPHATE 4 MG/ML IJ SOLN (WRAP)
4.00 mg | Freq: Once | INTRAMUSCULAR | Status: AC
Start: 2018-03-03 — End: 2018-03-03
  Administered 2018-03-03: 20:00:00 4 mg via INTRAVENOUS
  Filled 2018-03-03: qty 1

## 2018-03-03 MED ORDER — SODIUM CHLORIDE 0.9 % IV BOLUS
1000.00 mL | Freq: Once | INTRAVENOUS | Status: AC
Start: 2018-03-03 — End: 2018-03-03
  Administered 2018-03-03: 20:00:00 1000 mL via INTRAVENOUS

## 2018-03-03 NOTE — Discharge Instructions (Signed)
Dear Nicole Mcknight:    Thank you for choosing the [facility] Emergency Department, the premier emergency department in the [place] area.  I hope your visit today was EXCELLENT.    Specific instructions for your visit today:      Headache    You have been treated for a headache.    Headaches are very common. Most of the time they are benign (not harmful). Some headaches can be very serious. Your headache appears to be benign. The doctor feels it is OK for you to go home.    If you continue to have headaches, or if this headache does not resolve over the next few days, you should be evaluated by your regular doctor or a neurologist. Keep a "headache diary." This may help your doctor learn the cause of your headaches.    Take your headache medication as directed. This is especially important if your doctor has placed you on a daily medication to prevent headaches.    YOU SHOULD SEEK MEDICAL ATTENTION IMMEDIATELY, EITHER HERE OR AT THE NEAREST EMERGENCY DEPARTMENT, IF ANY OF THE FOLLOWING OCCURS:   Your headache gets worse.   You have a severe headache that occurs suddenly.   Your head pain is different from your normal headache.   You have a fever (temperature higher than 100.19F / 38C), especially with a stiff neck.   You feel numbness, tingling, or weakness in your arms or legs.   You pass out.   You have problems with your vision.   You vomit and have trouble taking medication or keeping it down.                 If you do not continue to improve or your condition worsens, please contact your doctor or return immediately to the Emergency Department.    Sincerely,  Gae Bon, MD  Attending Emergency Physician  [facility] Emergency Department    ONSITE PHARMACY  Our full service onsite pharmacy is located in the ER waiting room.  Open 7 days a week from 9 am to 9 pm.  We accept all major insurances and prices are competitive with major retailers.  Ask your provider to print your  prescriptions down to the pharmacy to speed you on your way home.    OBTAINING A PRIMARY CARE APPOINTMENT    Primary care physicians (PCPs, also known as primary care doctors) are either internists or family medicine doctors. Both types of PCPs focus on health promotion, disease prevention, patient education and counseling, and treatment of acute and chronic medical conditions.    Call for an appointment with a primary care doctor.  Ask to see who is taking new patients.     [facility]  telephone:  [phone]  [email or web address]    DOCTOR REFERRALS  Call [phone] (available 24 hours a day, 7 days a week) if you need any further referrals and we can help you find a primary care doctor or specialist.  Also, available online at:  [email or web address]    YOUR CONTACT INFORMATION  Before leaving please check with registration to make sure we have an up-to-date contact number.  You can call registration at [phone] to update your information.  For questions about your hospital bill, please call [phone].  For questions about your Emergency Dept Physician bill please call [phone].      FREE HEALTH SERVICES  If you need help with health or social services, please call 2-1-1 for a  free referral to resources in your area.  2-1-1 is a free service connecting people with information on health insurance, free clinics, pregnancy, mental health, dental care, food assistance, housing, and substance abuse counseling.  Also, available online at:  http://www.[place].org    MEDICAL RECORDS AND TESTS  Certain laboratory test results do not come back the same day, for example urine cultures.   We will contact you if other important findings are noted.  Radiology films are often reviewed again to ensure accuracy.  If there is any discrepancy, we will notify you.      Please call [phone] to pick up a complimentary CD of any radiology studies performed.  If you or your doctor  would like to request a copy of your medical records, please call [phone].      ORTHOPEDIC INJURY   Please know that significant injuries can exist even when an initial x-ray is read as normal or negative.  This can occur because some fractures (broken bones) are not initially visible on x-rays.  For this reason, close outpatient follow-up with your primary care doctor or bone specialist (orthopedist) is required.    MEDICATIONS AND FOLLOWUP  Please be aware that some prescription medications can cause drowsiness.  Use caution when driving or operating machinery.    The examination and treatment you have received in our Emergency Department is provided on an emergency basis, and is not intended to be a substitute for your primary care physician.  It is important that your doctor checks you again and that you report any new or remaining problems at that time.      [place]  The nearest 24 hour pharmacy is:    [facility] [place], [place] [address]  [place]  [facility])  Call to start or finish an application, compare plans, enroll or ask a question.  [place]: [phone]  Web:  Healthcare.gov    Help Enrolling in [place]  [phone] (TOLL-FREE)  [phone] (TTY)  Web:  Http://www.coverva.org    Local Help Enrolling in the [place]  ([phone] [phone] (MAIN)  Email:  health-help@nvfs .org  Web:  http://lewis-perez.info/  Address:  [address], [address] [place], [place] [address]    SEDATING MEDICATIONS  Sedating medications include strong pain medications (e.g. narcotics), muscle relaxers, benzodiazepines (used for anxiety and as muscle relaxers), Benadryl/diphenhydramine and other antihistamines for allergic reactions/itching, and other medications.  If you are unsure if you have received a sedating medication, please ask your physician or nurse.  If you  received a sedating medication: DO NOT drive a car. DO NOT operate machinery. DO NOT perform jobs where you need to be alert.  DO NOT drink alcoholic beverages while taking this medicine.     If you get dizzy, sit or lie down at the first signs. Be careful going up and down stairs.  Be extra careful to prevent falls.     Never give this medicine to others.     Keep this medicine out of reach of children.     Do not take or save old medicines. Throw them away when outdated.     Keep all medicines in a cool, dry place. DO NOT keep them in your bathroom medicine cabinet or in a cabinet above the stove.    MEDICATION REFILLS  Please  be aware that we cannot refill any prescriptions through the ER. If you need further treatment from what is provided at your ER visit, please follow up with your primary care doctor or your pain management specialist.    [place]  Did you know [facility] has two freestanding ERs located just a few miles away?  [place] [facility] [place] [facility] [place] have short wait times, easy free parking directly in front of the building and top patient satisfaction scores - and the same Board Certified Emergency Medicine doctors as [facility].

## 2018-03-03 NOTE — ED Provider Notes (Signed)
[facility] EMERGENCY DEPARTMENT H&P         CLINICAL INFORMATION        HPI:      Chief Complaint: Headache and Hypertension  .    Nicole Mcknight is a 52 y.o. female who presents with persistent moderate headache for 2 weeks.    History of palpitations, HTN.   She reports that her headache is posterior and left sided. Today it was worse and she was feeling lethargic and had palpitations asa well.   Denies shortness of breath, CP, numbness, weakness, nausea, vomiting, diarrhea, neck pain.     History obtained from: Patient          ROS:      Positive and negative ROS elements as per HPI.  All other systems reviewed and negative.      Physical Exam:      Pulse 73  BP 146/73  Resp 15  SpO2 98 %  Temp 98.6 F (37 C)    Physical Exam  Constitutional:       General: She is not in acute distress.     Appearance: She is well-developed. She is not diaphoretic.   HENT:      Head: Normocephalic and atraumatic.   Eyes:      Conjunctiva/sclera: Conjunctivae normal.      Pupils: Pupils are equal, round, and reactive to light.   Neck:      Comments: No meningeal signs.  Cardiovascular:      Rate and Rhythm: Normal rate and regular rhythm.      Heart sounds: Normal heart sounds.   Pulmonary:      Effort: Pulmonary effort is normal. No respiratory distress.      Breath sounds: Normal breath sounds. No wheezing.   Abdominal:      General: Bowel sounds are normal.      Palpations: Abdomen is soft.      Tenderness: There is no tenderness.   Musculoskeletal: Normal range of motion.         General: No tenderness.   Skin:     General: Skin is warm and dry.   Neurological:      General: No focal deficit present.      Mental Status: She is alert and oriented to person, place, and time.      Cranial Nerves: No cranial nerve deficit.      Sensory: No sensory deficit.      Coordination: Coordination normal.   Psychiatric:         Behavior: Behavior normal.         Thought Content: Thought content  normal.         Judgment: Judgment normal.                  PAST HISTORY        Primary Care Provider: Ludger Nutting, MD        PMH/PSH:    .     Past Medical History:   Diagnosis Date   . Heart palpitations     been followed in past   . Hypertensive disorder        She has no past surgical history on file.      Social/Family History:      She reports that she has never smoked. She has never used smokeless tobacco. She reports current alcohol use. She reports that she does not use drugs.    Family History   Problem Relation Age  of Onset   . Hypertension Mother    . Diabetes Father    . Cancer Brother         lung ca   . Pulmonary embolism Brother          Listed Medications on Arrival:    .     Home Medications     Med List Status:  In Progress Set By: Dorthula Rue, RN at 03/03/2018  6:29 PM                aspirin EC 81 MG EC tablet     Take 81 mg by mouth daily.     atenolol (TENORMIN) 25 MG tablet          Cholecalciferol (VITAMIN D) 2000 UNITS tablet     Take 2,000 Units by mouth daily.     methIMAzole (TAPAZOLE) 10 MG tablet          Multiple Vitamins-Minerals (MULTIVITAMIN PO)     Take by mouth.         Allergies: She has No Known Allergies.           Visit date: 03/03/2018      CLINICAL SUMMARY          Diagnosis:    .     Final diagnoses:   Headache, unspecified headache type         MDM Notes:        DDx-       HTN, cluster headache, migraine headache.      Pt is a 52yF with HA for two weeks. BP initially elevated, resolved without intervention. No meningeal signs, no lethargy, no AMS. No meningeal signs. CT head neg. Labs without metabolic or infectious abnormalities. Analgesics/antiemetics given, HA resolved. No end-organ damage noted, no hypertensive urgency or emergency noted.                  VISIT INFORMATION        Clinical Course in the ED:                 Medications Given in the ED:    .     ED Medication Orders (From admission, onward)    Start Ordered     Status Ordering Provider    03/03/18 2201  03/03/18 2200  ketorolac (TORADOL) injection 30 mg  Once     Route: Intravenous  Ordered Dose: 30 mg     Last MAR action:  Given Melissa Tomaselli ABID    03/03/18 2000 03/03/18 1959  metoclopramide (REGLAN) injection 10 mg  Once     Route: Intravenous  Ordered Dose: 10 mg     Last MAR action:  Given Kapena Hamme ABID    03/03/18 2000 03/03/18 1959  diphenhydrAMINE (BENADRYL) injection 25 mg  Once     Route: Intravenous  Ordered Dose: 25 mg     Last MAR action:  Given Bethenny Losee ABID    03/03/18 2000 03/03/18 1959  sodium chloride 0.9 % bolus 1,000 mL  Once     Route: Intravenous  Ordered Dose: 1,000 mL     Last MAR action:  New Bag Symphanie Cederberg ABID    03/03/18 1959 03/03/18 1959  dexamethasone (DECADRON) injection 4 mg  Once     Route: Intravenous  Ordered Dose: 4 mg     Last MAR action:  Given Gurinder Toral ABID            Procedures:  Procedures      Interpretations:        O2 sat-           saturation: 98 %; Oxygen use: room air; Interpretation: Normal                     RESULTS        Lab Results:      Results     Procedure Component Value Units Date/Time    TSH [[id number]] Collected:  03/03/18 2020    Specimen:  Blood Updated:  03/03/18 2118     TSH 3.61 uIU/mL     Troponin I [[id number]] Collected:  03/03/18 2020    Specimen:  Blood Updated:  03/03/18 2103     Troponin I <0.01 ng/mL     Magnesium [[id number]] Collected:  03/03/18 2020    Specimen:  Blood Updated:  03/03/18 2056     Magnesium 2.1 mg/dL     Basic Metabolic Panel [[id number]] Collected:  03/03/18 1818    Specimen:  Blood Updated:  03/03/18 1857     Glucose 78 mg/dL      BUN 41.6 mg/dL      Creatinine 0.8 mg/dL      Calcium 9.4 mg/dL      Sodium 606 mEq/L      Potassium 3.7 mEq/L      Chloride 103 mEq/L      CO2 26 mEq/L     GFR [[id number]] Collected:  03/03/18 1818     Updated:  03/03/18 1857     EGFR >60.0    CBC with differential [[id number]]  (Abnormal) Collected:  03/03/18 1818    Specimen:  Blood Updated:  03/03/18 1848     WBC 7.48 x10 3/uL      Hgb  13.3 g/dL      Hematocrit 25.4 %      Platelets 265 x10 3/uL      RBC 4.50 x10 6/uL      MCV 94.2 fL      MCH 29.6 pg      MCHC 31.4 g/dL      RDW 13 %      MPV 11.1 fL      Neutrophils 63.7 %      Lymphocytes Automated 29.3 %      Monocytes 5.1 %      Eosinophils Automated 1.5 %      Basophils Automated 0.3 %      Immature Granulocyte 0.1 %      Nucleated RBC 0.0 /100 WBC      Neutrophils Absolute 4.77 x10 3/uL      Abs Lymph Automated 2.19 x10 3/uL      Abs Mono Automated 0.38 x10 3/uL      Abs Eos Automated 0.11 x10 3/uL      Absolute Baso Automated 0.02 x10 3/uL      Absolute Immature Granulocyte 0.01 x10 3/uL      Absolute NRBC 0.00 x10 3/uL               Radiology Results:          CT Head without Contrast   Final Result    No hemorrhage or acute intracranial abnormality is detected.      Otho Ket, MD    03/03/2018 9:53 PM              Disposition:           Discharge  Discharge Prescriptions     Medication Sig Dispense Auth. Provider    butalbital-acetaminophen-caffeine (FIORICET, ESGIC) 50-325-40 MG per tablet Take 1 tablet by mouth every 6 (six) hours as needed for Headaches 15 tablet Gae Bon, MD                     Scribe Attestation:      I was acting as a Neurosurgeon for Palma Holter, MD on [facility]  Treatment Team: Scribe: Karoline Caldwell     I am the first provider for this patient and I personally performed the services documented. Treatment Team: Scribe: Karoline Caldwell is scribing for me on [place],Nicole A. This note and the patient instructions accurately reflect work and decisions made by me.  Palma Holter, MD          Gae Bon, MD  03/07/18 [phone]

## 2018-03-04 LAB — ECG 12-LEAD
Atrial Rate: 62 {beats}/min
P Axis: 56 degrees
P-R Interval: 150 ms
Q-T Interval: 418 ms
QRS Duration: 72 ms
QTC Calculation (Bezet): 424 ms
R Axis: -2 degrees
T Axis: 32 degrees
Ventricular Rate: 62 {beats}/min

## 2021-02-04 ENCOUNTER — Other Ambulatory Visit: Payer: Self-pay

## 2021-02-04 ENCOUNTER — Emergency Department: Payer: 59

## 2021-02-04 ENCOUNTER — Emergency Department
Admission: EM | Admit: 2021-02-04 | Discharge: 2021-02-04 | Disposition: A | Discharge status: Home / Self Care | Payer: 59 | Attending: Emergency Medicine | Admitting: Emergency Medicine

## 2021-02-04 DIAGNOSIS — I10 Essential (primary) hypertension: Secondary | ICD-10-CM | POA: Diagnosis not present

## 2021-02-04 DIAGNOSIS — R42 Dizziness and giddiness: Secondary | ICD-10-CM | POA: Diagnosis not present

## 2021-02-04 HISTORY — DX: Disorder of thyroid, unspecified: E07.9

## 2021-02-04 HISTORY — DX: Essential (primary) hypertension: I10

## 2021-02-04 LAB — CBC
HCT: 38.4 % (ref 36.0–46.0)
Hemoglobin: 12.4 g/dL (ref 12.0–15.0)
MCH: 30.3 pg (ref 26.0–34.0)
MCHC: 32.3 g/dL (ref 30.0–36.0)
MCV: 93.9 fL (ref 80.0–100.0)
Platelets: 271 10*3/uL (ref 150–400)
RBC: 4.09 MIL/uL (ref 3.87–5.11)
RDW: 13 % (ref 11.5–15.5)
WBC: 9.1 10*3/uL (ref 4.0–10.5)
nRBC: 0 % (ref 0.0–0.2)

## 2021-02-04 LAB — URINALYSIS, COMPLETE (UACMP) WITH MICROSCOPIC
Bacteria, UA: NONE SEEN
Bilirubin Urine: NEGATIVE
Glucose, UA: NEGATIVE mg/dL
Hgb urine dipstick: NEGATIVE
Ketones, ur: NEGATIVE mg/dL
Nitrite: NEGATIVE
Protein, ur: NEGATIVE mg/dL
Specific Gravity, Urine: 1.009 (ref 1.005–1.030)
pH: 7 (ref 5.0–8.0)

## 2021-02-04 LAB — BASIC METABOLIC PANEL
Anion gap: 7 (ref 5–15)
BUN: 12 mg/dL (ref 6–20)
CO2: 25 mmol/L (ref 22–32)
Calcium: 8.8 mg/dL — ABNORMAL LOW (ref 8.9–10.3)
Chloride: 106 mmol/L (ref 98–111)
Creatinine, Ser: 0.85 mg/dL (ref 0.44–1.00)
GFR, Estimated: >60 mL/min (ref >60)
Glucose, Bld: 83 mg/dL (ref 70–99)
Potassium: 3.6 mmol/L (ref 3.5–5.1)
Sodium: 138 mmol/L (ref 135–145)

## 2021-02-04 LAB — TSH: TSH: 3.843 u[IU]/mL (ref 0.350–4.500)

## 2021-02-04 LAB — TROPONIN I (HIGH SENSITIVITY)
Troponin I (High Sensitivity): 9 ng/L (ref <18)
Troponin I (High Sensitivity): 9 ng/L (ref <18)

## 2021-02-04 LAB — MAGNESIUM: Magnesium: 2 mg/dL (ref 1.7–2.4)

## 2021-02-04 LAB — T4, FREE: Free T4: 0.85 ng/dL (ref 0.61–1.12)

## 2021-02-04 MED ORDER — DIPHENHYDRAMINE HCL 50 MG/ML IJ SOLN
12.5000 mg | Freq: Once | INTRAMUSCULAR | Status: AC
  Administered 2021-02-04: 12.5 mg via INTRAVENOUS
  Filled 2021-02-04: qty 1

## 2021-02-04 MED ORDER — MECLIZINE HCL 25 MG PO TABS: 25.0000 mg | ORAL_TABLET | Freq: Three times a day (TID) | ORAL | 0 refills | Status: AC | PRN

## 2021-02-04 MED ORDER — IOHEXOL 350 MG/ML SOLN
100.0000 mL | Freq: Once | INTRAVENOUS | Status: AC | PRN
  Administered 2021-02-04: 100 mL via INTRAVENOUS
  Filled 2021-02-04: qty 100

## 2021-02-04 MED ORDER — METOCLOPRAMIDE HCL 5 MG/ML IJ SOLN
10.0000 mg | Freq: Once | INTRAMUSCULAR | Status: AC
  Administered 2021-02-04: 10 mg via INTRAVENOUS
  Filled 2021-02-04: qty 2

## 2021-02-04 MED ORDER — SODIUM CHLORIDE 0.9 % IV BOLUS
500.0000 mL | Freq: Once | INTRAVENOUS | Status: AC
  Administered 2021-02-04: 500 mL via INTRAVENOUS

## 2021-02-04 MED ORDER — MECLIZINE HCL 25 MG PO TABS
25.0000 mg | ORAL_TABLET | Freq: Once | ORAL | Status: AC
  Administered 2021-02-04: 25 mg via ORAL
  Filled 2021-02-04: qty 1

## 2021-02-04 NOTE — ED Provider Notes (Signed)
[facility] Emergency Department Provider Note  ____________________________________________   Event Date/Time   First MD Initiated Contact with Patient 02/04/21 1636     (approximate)  I have reviewed the triage vital signs and the nursing notes.   HISTORY  Chief Complaint Dizziness    HPI Kathy Chen is a 55 y.o. female with hypertension and thyroid disease who comes in with concerns for sudden onset of dizziness.  Patient states that she has been in the car traveling from [place] to [place].  She states that her son was driving.  She states that she stopped a few hours into the trip and was able to get up and move around without any symptoms.  However around 2 PM they made another stop and when she went to stand up she started to feel very dizzy in nature and felt like she was going to fall over.  She states that she tried to ambulate to the bathroom and continue to feel off balance.  She states that when she is at rest she still has a little bit of the dizziness and some nausea but gets especially worse when she tries to sit up.  She states that she had not really been drinking well today but after the symptoms started she did try to drink a bunch of water.  She reports that when she was in the waiting room waiting to be seen and get back to her room she developed a mild headache.  It was not sudden or severe in onset.  It was just a mild dull ache that gradually came on.  She also reports a history of palpitations being seen by cardiology has had Holter monitors and she states that she feels like she is having a few more palpitations today.  She denies that the cardiologist stated that she had any abnormal rhythms that were life-threatening..  She denies any shortness of breath.  Denies any chest pain.          Past Medical History:  Diagnosis Date   Hypertension    Thyroid disease     There are no problems to display for this  patient.   History reviewed. No pertinent surgical history.  Prior to Admission medications   Not on File    Allergies Patient has no allergy information on record.  No family history on file.  Social History      Review of Systems Constitutional: No fever/chills dizzy Eyes: No visual changes. ENT: No sore throat. Cardiovascular: Denies chest pain.  Palpitation Respiratory: Denies shortness of breath. Gastrointestinal: No abdominal pain.  Nausea.  No diarrhea.  No constipation. Genitourinary: Negative for dysuria. Musculoskeletal: Negative for back pain. Skin: Negative for rash. Neurological: Negative for headaches, focal weakness or numbness. All other ROS negative ____________________________________________   PHYSICAL EXAM:  VITAL SIGNS: ED Triage Vitals [02/04/21 1508]  Enc Vitals Group     BP (!) 189/106     Pulse Rate 68     Resp 20     Temp 98.7 F (37.1 C)     Temp Source Oral     SpO2 99 %     Weight 240 lb (108.9 kg)     Height 5\' 6"  (1.676 m)     Head Circumference      Peak Flow      Pain Score 0     Pain Loc      Pain Edu?      Excl. in GC?  Constitutional: Alert and oriented. Well appearing and in no acute distress. Eyes: Conjunctivae are normal. EOMI. patient has no field cuts.  Pupils are reactive bilaterally. Head: Atraumatic. Tms clear  Nose: No congestion/rhinnorhea. Mouth/Throat: Mucous membranes are moist.   Neck: No stridor. Trachea Midline. FROM Cardiovascular: Normal rate, regular rhythm. Grossly normal heart sounds.  Good peripheral circulation. Respiratory: Normal respiratory effort.  No retractions. Lungs CTAB. Gastrointestinal: Soft and nontender. No distention. No abdominal bruits.  Musculoskeletal: No lower extremity tenderness nor edema.  No joint effusions. Neurologic:  Normal speech and language. No gross focal neurologic deficits are appreciated.  Cranial nerves II through XII are intact.  Equal strength in arms  and legs.  Finger-to-nose intact bilaterally.  When patient first sits up she reports worsening symptoms of the dizziness and when she takes a few steps she stumbled slightly but then was able to catch herself and continue walking unassisted.  No visual field cuts. Skin:  Skin is warm, dry and intact. No rash noted. Psychiatric: Mood and affect are normal. Speech and behavior are normal. GU: Deferred   ____________________________________________   LABS (all labs ordered are listed, but only abnormal results are displayed)  Labs Reviewed  BASIC METABOLIC PANEL - Abnormal; Notable for the following components:      Result Value   Calcium 8.8 (*)    All other components within normal limits  CBC  URINALYSIS, COMPLETE (UACMP) WITH MICROSCOPIC  TSH  T4, FREE  MAGNESIUM  TROPONIN I (HIGH SENSITIVITY)   ____________________________________________   ED ECG REPORT I, Concha SeMary E Efren Kross, the attending physician, personally viewed and interpreted this ECG.  Normal sinus rate of 65, no ST elevation, no T wave inversions, occasional PVC ____________________________________________  RADIOLOGY   Official radiology report(s): CT ANGIO HEAD NECK W WO CM  Result Date: 02/04/2021 CLINICAL DATA:  Neuro deficit, acute, stroke suspected. Additional history provided: Dizziness and near-syncope. EXAM: CT ANGIOGRAPHY HEAD AND NECK TECHNIQUE: Multidetector CT imaging of the head and neck was performed using the standard protocol during bolus administration of intravenous contrast. Multiplanar CT image reconstructions and MIPs were obtained to evaluate the vascular anatomy. Carotid stenosis measurements (when applicable) are obtained utilizing NASCET criteria, using the distal internal carotid diameter as the denominator. CONTRAST:  100mL OMNIPAQUE IOHEXOL 350 MG/ML SOLN COMPARISON:  No pertinent prior exams available for comparison. FINDINGS: CT HEAD FINDINGS Brain: Cerebral volume is normal. There is no  acute intracranial hemorrhage. No demarcated cortical infarct. No extra-axial fluid collection. No evidence of an intracranial mass. No midline shift. Partially empty sella turcica. Vascular: No hyperdense vessel. Skull: Normal. Negative for fracture or focal lesion. Sinuses: Mild mucosal thickening within the left maxillary sinus. Orbits: No acute or significant finding. Review of the MIP images confirms the above findings CTA NECK FINDINGS Aortic arch: Standard aortic branching. The visualized aortic arch is normal in caliber. Streak artifact from a dense left-sided contrast bolus partially obscures the proximal left subclavian artery. Within this limitation, there is no evidence of a hemodynamically significant stenosis within the innominate or proximal subclavian arteries. Right carotid system: CCA and ICA patent within the neck without stenosis. Beaded irregularity of the cervical ICA highly suspicious for fibromuscular dysplasia. Left carotid system: CCA and ICA patent within the neck without stenosis. Subtle beaded irregularity of the cervical ICA, highly suspicious for fibromuscular dysplasia. Possible carotid web within the posteroinferior aspect of the left carotid bulb (series 8, image 145). Vertebral arteries: Vertebral arteries patent within the neck. Apparent moderate/severe stenosis  within the proximal V2 left vertebral artery. Skeleton: Cervical spondylosis. No acute bony abnormality or aggressive osseous lesion. Other neck: No neck mass or cervical lymphadenopathy. Upper chest: No consolidation within the imaged lung apices. Review of the MIP images confirms the above findings CTA HEAD FINDINGS Anterior circulation: The intracranial internal carotid arteries are patent. The M1 middle cerebral arteries are patent. No M2 proximal branch occlusion or high-grade proximal stenosis is identified. The anterior cerebral arteries are patent. The right A1 segment is markedly hypoplastic or developmentally  absent. 4 mm anterior communicating artery aneurysm. 4 mm aneurysm arising from the region of the left ICA terminus (series 6, image 250). Bulbous appearance of the right MCA bifurcation measuring 4-5 mm, likely reflecting an aneurysm (series 7, image 86). 2-3 mm posteriorly projecting vascular protrusion arising from the supraclinoid left ICA, likely reflecting an aneurysm (series 8, image 133). Posterior circulation: The intracranial vertebral arteries are patent. The basilar artery is patent. The posterior cerebral arteries are patent. Posterior communicating arteries are hypoplastic or absent bilaterally. Suspected 1-2 mm aneurysm arising from the P3 right posterior cerebral artery (series 11, image 23). Venous sinuses: Within the limitations of contrast timing, no convincing thrombus. Anatomic variants: As described Review of the MIP images confirms the above findings IMPRESSION: CT head: 1. No evidence of acute intracranial abnormality. 2. Partially empty sella turcica. This finding is very commonly incidental, but can be associated with idiopathic intracranial hypertension. CTA neck: 1. The common carotid and internal carotid arteries are patent within the neck without hemodynamically significant stenosis. There is irregularity of the right greater than left cervical internal carotid arteries which is highly suggestive of fibromuscular dysplasia. Additionally, there is a possible carotid web within the left carotid bulb (this may be thrombogenic). 2. Apparent moderate/severe stenosis within the proximal V2 left vertebral artery. This apparent stenosis could potentially be accentuated by artifact. CTA head: 1. No intracranial large vessel occlusion or proximal high-grade arterial stenosis. 2. 4 mm anterior communicating artery aneurysm. 3. 4 mm aneurysm arising from the region of the left ICA terminus. 4. Bulbous appearance of the right MCA bifurcation measuring 4-5 mm, likely reflecting an aneurysm. 5. 2-3  aneurysm arising from the supraclinoid left ICA. 6. Suspected 1-2 mm aneurysm arising from the P3 right posterior cerebral artery. Electronically Signed   By: Jackey Loge D.O.   On: 02/04/2021 18:39   MR BRAIN WO CONTRAST  Result Date: 02/04/2021 CLINICAL DATA:  Neuro deficit, acute, stroke suspected EXAM: MRI HEAD WITHOUT CONTRAST TECHNIQUE: Multiplanar, multiecho pulse sequences of the brain and surrounding structures were obtained without intravenous contrast. COMPARISON:  None. FINDINGS: Brain: No acute infarct, mass effect or extra-axial collection. No acute or chronic hemorrhage. There is multifocal hyperintense T2-weighted signal within the white matter. Parenchymal volume and CSF spaces are normal. The midline structures are normal. Vascular: Major flow voids are preserved. Skull and upper cervical spine: Normal calvarium and skull base. Visualized upper cervical spine and soft tissues are normal. Sinuses/Orbits:No paranasal sinus fluid levels or advanced mucosal thickening. No mastoid or middle ear effusion. Normal orbits. IMPRESSION: 1. No acute intracranial abnormality. 2. Findings of chronic microvascular ischemia. Electronically Signed   By: Deatra Robinson M.D.   On: 02/04/2021 20:24    ____________________________________________   PROCEDURES  Procedure(s) performed (including Critical Care):  Procedures   ____________________________________________   INITIAL IMPRESSION / ASSESSMENT AND PLAN / ED COURSE  Kathy Chen was evaluated in Emergency Department on 02/04/2021 for the symptoms described in the history  of present illness. She was evaluated in the context of the global COVID-19 pandemic, which necessitated consideration that the patient might be at risk for infection with the SARS-CoV-2 virus that causes COVID-19. Institutional protocols and algorithms that pertain to the evaluation of patients at risk for COVID-19 are in a state of rapid change based on information  released by regulatory bodies including the CDC and federal and state organizations. These policies and algorithms were followed during the patient's care in the ED.    Patient comes in with concerns for dizziness that started after standing up after being in the car for a long period of time.  I suspect this is most likely vertigo in nature given its very positional nature.  Symptom onset was at 2 PM on 02/04/2021 but stroke code was not called due to my low suspicion for stroke I do not think patient would be a tPA candidate secondary to her very mild symptoms.  She has no visual field cuts, no finger-nose ataxia.  Negative Romberg and is ambulating without any assistance.  Her only symptom is the vertigo and headache.  Given her hypertension I think a CT head to rule out intracranial hemorrhage and a CTA to rule out any kind of vessel issue that could put her at higher risk for a posterior stroke would be reasonable.  Labs ordered to evaluate for Electra abnormalities, AKI, ACS as well as thyroid dysfunction.  She denies any shortness of breath to suggest PE and she is not tachycardic or hypoxic.  She reports some palpitations but does have some PVCs on her EKG which she can follow-up with cardiology for.   6:24 PM patient states that her headache is now resolved but she is having still little bit of the dizziness.  We will give a dose of meclizine.  7:09 PM patient states that she feels foggy but is now able to ambulate much better.  Her headache remains resolved.  Given her abnormal CTA I do think we should get an MRI just to make sure there is no evidence of stroke given her vessels.  I recommended that patient follow-up with a neurosurgeon for angiogram to see if any of these aneurysms need to be treated.  At this time I do not think that she is any evidence of leaking.  Her headache was not severe sudden onset and was a mild headache that started while sitting in the waiting room most likely secondary  from the dehydration and the vertigo symptoms.  Also her CT scan was within an hour or 2 of her headache starting so be very sensitive to ruling out intracranial hemorrhage.  We discussed lumbar puncture but have opted to hold off at this time.  She is ambulatory without any headache and with a intact neuro exam making aneurysm leak very unlikely.  8:42 PM Reevaluated patient given MRI was negative.  And she states she is feeling much better at this time.  Denies really any dizziness.  She feels comfortable with going home and will provide a prescription for meclizine and she will follow-up with neurosurgery outpatient.  She will restart taking her lisinopril hydrochlorothiazide and keep a record of her blood pressures.  She understands the importance of keeping her blood pressure low due to these new aneurysms and she understands that if she develops a sudden or severe onset headache that she needs to return to the ER immediately due to her being higher risk for intercranial hemorrhage.  I discussed the provisional  nature of ED diagnosis, the treatment so far, the ongoing plan of care, follow up appointments and return precautions with the patient and any family or support people present. They expressed understanding and agreed with the plan, discharged home.    ____________________________________________   FINAL CLINICAL IMPRESSION(S) / ED DIAGNOSES   Final diagnoses:  Vertigo      MEDICATIONS GIVEN DURING THIS VISIT:  Medications  metoCLOPramide (REGLAN) injection 10 mg (10 mg Intravenous Given 02/04/21 1709)  diphenhydrAMINE (BENADRYL) injection 12.5 mg (12.5 mg Intravenous Given 02/04/21 1709)  sodium chloride 0.9 % bolus 500 mL (0 mLs Intravenous Stopped 02/04/21 1828)  iohexol (OMNIPAQUE) 350 MG/ML injection 100 mL (100 mLs Intravenous Contrast Given 02/04/21 1738)  meclizine (ANTIVERT) tablet 25 mg (25 mg Oral Given 02/04/21 1844)     ED Discharge Orders          Ordered     meclizine (ANTIVERT) 25 MG tablet  3 times daily PRN        02/04/21 2041             Note:  This document was prepared using Dragon voice recognition software and may include unintentional dictation errors.    Concha Se, MD 02/04/21 2045

## 2021-02-04 NOTE — Discharge Instructions (Addendum)
We incidentally found some aneurysms in on your CT scan.  We recommend that you follow-up with a neurosurgeon.  Our neurosurgeon stated that you need a formal angiogram to see how large these aneurysms are and to see if any of them need treatment.  In the meantime is important that you get your blood pressure better controlled.  Take your blood pressure once in the morning and once at nighttime.  Please record these for your primary care doctor and if they are consistently staying over 140s over 90s you need to talk to them about adjusting your medications.  Return to the ER if you develop sudden severe onset of headache confusion or any other concerns.   CT head:     1. No evidence of acute intracranial abnormality.  2. Partially empty sella turcica. This finding is very commonly  incidental, but can be associated with idiopathic intracranial  hypertension.     CTA neck:     1. The common carotid and internal carotid arteries are patent  within the neck without hemodynamically significant stenosis. There  is irregularity of the right greater than left cervical internal  carotid arteries which is highly suggestive of fibromuscular  dysplasia. Additionally, there is a possible carotid web within the  left carotid bulb (this may be thrombogenic).  2. Apparent moderate/severe stenosis within the proximal V2 left  vertebral artery. This apparent stenosis could potentially be  accentuated by artifact.     CTA head:     1. No intracranial large vessel occlusion or proximal high-grade  arterial stenosis.  2. 4 mm anterior communicating artery aneurysm.  3. 4 mm aneurysm arising from the region of the left ICA terminus.  4. Bulbous appearance of the right MCA bifurcation measuring 4-5 mm,  likely reflecting an aneurysm.  5. 2-3 aneurysm arising from the supraclinoid left ICA.  6. Suspected 1-2 mm aneurysm arising from the P3 right posterior  cerebral artery.

## 2021-02-04 NOTE — ED Triage Notes (Signed)
Pt to ED for dizziness and near syncope that started this afternoon. Has been traveling, states does not usually get motion sickness. Denies hx vertigo.  States her blood pressure has been elevated but has not started medications yet.  Denies pain, headache or weakness.  States feels like having palpitations, has hx of same, palpitations getting more frequent today

## 2021-03-12 ENCOUNTER — Ambulatory Visit
Admission: RE | Admit: 2021-03-12 | Discharge: 2021-03-12 | Disposition: A | Discharge status: Home / Self Care | Payer: Self-pay | Source: Ambulatory Visit

## 2021-03-22 NOTE — Progress Notes (Signed)
Patient ID: Taylorann Mccuen is a 55 y.o. female.    HPI  55 year old female with PMH of HTN, OSA, p/w episode of vertigo and found to have an aneurysm upon work up at [facility].      CTH: negative. CTA: Possible carotid web within the left carotid bulb (this may be thrombogenic). Apparent moderate/severe stenosis within the proximal V2 left vertebral artery.  4 mm anterior communicating artery aneurysm.  4 mm aneurysm arising from the region of the left ICA terminus. Bulbous appearance of the right MCA bifurcation measuring 4-5 mm, likely reflecting an aneurysm.  2-3 aneurysm arising from the supraclinoid left ICA. Suspected 1-2 mm aneurysm arising from the P3 right posterior cerebral artery.     Cerebral angiogram performed on 03/12/2021.  Selective injection of the right ICA demonstrates an aneurysm at the proximal inferior M2 branch measuring 3.2 mm (transverse) x 1.37 mm (AP) x 3.0 mm (craniocaudal), with a 2.94 mm neck. The aneurysm is incorporated into the inferior M2 vessel. There is a second wide neck aneurysm at the anterior communicating artery   measuring 4.69 mm (transverse) x 1.96 mm (AP) x 5.05 mm (craniocaudal).   There is a third saccular shaped aneurysm at the terminal segment of the   left ICA measuring 2.96 mm (transverse) x 3.32 mm (AP) x 4.04 mm   (craniocaudal), with a 2.56 mm neck. There is a forth bilobed shaped aneurysm at the communicating segment of the left ICA measuring 3.86 mm (transverse) x 4.55 mm (AP) x 2.43 mm (craniocaudal), with a 3.18 mm neck. The posterior communicating artery originates off a dome of the aneurysm.  Currently, she is doing well.       Review of Systems   Allergies   Allergen Reactions   . Grass Pollen([id number] Vern)         Current Outpatient Medications:   .  albuterol 108 (90 Base) MCG/ACT inhaler, albuterol sulfate HFA 90 mcg/actuation aerosol inhaler, Disp: , Rfl:   .  cholecalciferol (Vitamin D-3) 50 MCG (2000 UT) tablet, Take 2,000 Units by  mouth., Disp: , Rfl:   .  lisinopril-hydroCHLOROthiazide 10-12.5 MG tablet, Take 1 tablet by mouth daily., Disp: , Rfl:   .  methIMAzole (Tapazole) 5 MG tablet, Take 0.5 tablets by mouth daily., Disp: , Rfl:   .  MULTIPLE VITAMINS-MINERALS ER PO, Take by mouth., Disp: , Rfl:   .  sertraline (Zoloft) 25 MG tablet, , Disp: , Rfl:      Objective      PE: Exam performed through Tele-medicine video.    Neuro: Alert and oriented.  No aphasia.   Cranial Nerves: Visual fields: Intact, EOMI, no nystagmus, no facial weakness, tongue-midline.  Strength: Moving all extremities spontaneously, no drift.   Cerebellar: No dysmetria.    Assessment/Plan    Diagnoses and all orders for this visit:  Cerebral aneurysm       55 year old female with PMH of HTN, OSA, p/w episode of vertigo and found to have an aneurysm upon work up at [facility].      CTH: negative. CTA: Possible carotid web within the left carotid bulb (this may be thrombogenic). Apparent moderate/severe stenosis within the proximal V2 left vertebral artery.  4 mm anterior communicating artery aneurysm.  4 mm aneurysm arising from the region of the left ICA terminus. Bulbous appearance of the right MCA bifurcation measuring 4-5 mm, likely reflecting an aneurysm.  2-3 aneurysm arising from the supraclinoid left  ICA. Suspected 1-2 mm aneurysm arising from the P3 right posterior cerebral artery.     Cerebral angiogram performed on 03/12/2021.  Selective injection of the right ICA demonstrates an aneurysm at the proximal inferior M2 branch measuring 3.2 mm (transverse) x 1.37 mm (AP) x 3.0 mm (craniocaudal), with a 2.94 mm neck. The aneurysm is incorporated into the inferior M2 vessel. There is a second wide neck aneurysm at the anterior communicating artery   measuring 4.69 mm (transverse) x 1.96 mm (AP) x 5.05 mm (craniocaudal).   There is a third saccular shaped aneurysm at the terminal segment of the   left ICA measuring 2.96 mm (transverse) x 3.32 mm (AP) x 4.04  mm   (craniocaudal), with a 2.56 mm neck. There is a forth bilobed shaped aneurysm at the communicating segment of the left ICA measuring 3.86 mm (transverse) x 4.55 mm (AP) x 2.43 mm (craniocaudal), with a 3.18 mm neck. The posterior communicating artery originates off a dome of the aneurysm.  Currently, she is doing well.     -Informed patient that this is a complex situation because she has 4 cerebral aneurysms less than 7 mm in size, some of which appear to have daughter sacs.  She will discuss with her family and call back to let me know what she wants to do. If she decides to treat, I would recommend stent assisted coiling or only coiling embolization of the left ICA aneurysms.  The Acomm aneurysm will require stent assisted coiling, possibly Y stenting technique.  The right MCA aneurysm is small in the AP dimension, would recommend monitoring it only at this time.  If she decides to monitor all the aneurysms for now, recommend follow MRA head or CTA head every 6 months.    -Discussed risks of embolization procedure including stroke, cerebral hemorrhage, vessel dissection, infection, radiation injury, contrast allergy, kidney injury, infection, and groin hematoma.   -Discussed natural history of cerebral aneurysms and the high risk of mortality and morbidity associated with aneurysmal subarachnoid hemorrhage.   -Discussed endovascular treatment options.

## 2021-05-09 ENCOUNTER — Ambulatory Visit: Admission: RE | Admit: 2021-05-09 | Payer: Self-pay | Source: Ambulatory Visit

## 2021-07-15 ENCOUNTER — Ambulatory Visit: Payer: 59 | Admitting: Neurological Surgery

## 2021-07-15 ENCOUNTER — Telehealth: Payer: Self-pay

## 2021-07-15 NOTE — Telephone Encounter (Signed)
Attempted to call patient to cancel appointment due to power outtage.   Spoke with patient's spouse and is aware.     Informed that our scheduling coordinators will be giving them a call to reschedule.      8:42AM ET  07/15/21  Tyson Babinski, MA

## 2021-07-29 ENCOUNTER — Telehealth: Payer: Self-pay

## 2021-07-29 ENCOUNTER — Ambulatory Visit (INDEPENDENT_AMBULATORY_CARE_PROVIDER_SITE_OTHER): Payer: 59 | Admitting: Family

## 2021-07-29 ENCOUNTER — Encounter (INDEPENDENT_AMBULATORY_CARE_PROVIDER_SITE_OTHER): Payer: Self-pay | Admitting: Family

## 2021-07-29 VITALS — BP 142/80 | HR 75 | Temp 98.6°F | Resp 20 | Ht 66.0 in | Wt 240.0 lb

## 2021-07-29 DIAGNOSIS — Z0130 Encounter for examination of blood pressure without abnormal findings: Secondary | ICD-10-CM

## 2021-07-29 NOTE — Telephone Encounter (Signed)
Attempted to call patient to confirm appt with angiogram.     Patient stated that disk/report will be brought to appt. Obtained from [facility].    4:04PM ET  July 29, 2021   Tyson Babinski, [place]

## 2021-07-29 NOTE — Progress Notes (Unsigned)
[facility]  PROGRESS NOTE     Patient: Nicole Mcknight   Date: 07/29/2021   MRN: [id number]       SHONDA MANDARINO is a 56 y.o. female      HISTORY     History obtained from: Patient    Chief Complaint   Patient presents with    Hypertension     56 yo female c/o that she is concern of a high B/P today and headaches. No fever.        56 y/o with PMH of HTN on lisinopril 20mg  /HTZ 12.5  and brain aneurysm here for elevated blood pressure today. Blood pressure reading yest 126/76 and today 179/90, 160/87 and 139/70.  A/w mild occipital headache. Pt reports not eating all day today.   Pt has an appointment with Neurosurgeon tomorrow.   Last MRI 02/2021.      History provided by:  Patient  Language interpreter used: No       Review of Systems   Constitutional:  Negative for activity change, chills, diaphoresis, fatigue, fever and unexpected weight change.   HENT:  Negative for congestion, postnasal drip, rhinorrhea and sore throat.    Eyes:  Negative for visual disturbance.   Respiratory:  Negative for cough, chest tightness, shortness of breath and wheezing.    Cardiovascular:  Negative for chest pain, palpitations and leg swelling.   Gastrointestinal:  Negative for abdominal pain, diarrhea, nausea and vomiting.   Genitourinary:  Negative for difficulty urinating.   Musculoskeletal:  Negative for back pain, joint swelling, myalgias and neck pain.   Skin:  Negative for pallor and rash.   Neurological:  Negative for dizziness, syncope, weakness, light-headedness, numbness and headaches.   Psychiatric/Behavioral:  Negative for confusion. The patient is not nervous/anxious.    All other systems reviewed and are negative.    History:    Pertinent Past Medical, Surgical, Family and Social History were reviewed.        Current Outpatient Medications:     aspirin EC 81 MG EC tablet, Take 81 mg by mouth daily., Disp: , Rfl:     atenolol (TENORMIN) 25 MG tablet, , Disp: , Rfl:     butalbital-acetaminophen-caffeine  (FIORICET, ESGIC) 50-325-40 MG per tablet, Take 1 tablet by mouth every 6 (six) hours as needed for Headaches, Disp: 15 tablet, Rfl: 0    Cholecalciferol (VITAMIN D) 2000 UNITS tablet, Take 2,000 Units by mouth daily., Disp: , Rfl:     methIMAzole (TAPAZOLE) 10 MG tablet, , Disp: , Rfl:     Multiple Vitamins-Minerals (MULTIVITAMIN PO), Take by mouth., Disp: , Rfl:     No Known Allergies    Medications and Allergies reviewed.    PHYSICAL EXAM     Vitals:    07/29/21 1935 07/29/21 1950 07/29/21 2014   BP: (!) 156/93 140/80 142/80   Pulse: (!) 59  75   Resp: 20     Temp: 98.6 F (37 C)     Weight: 108.9 kg (240 lb)     Height: 1.676 m (5\' 6" )         Physical Exam  Constitutional:       General: She is not in acute distress.     Appearance: She is well-developed.   HENT:      Head: Normocephalic and atraumatic.     Eyes: Conjunctivae are normal. Neurological:      Mental Status: She is alert and oriented to person, place, and  time.   Skin:     General: Skin is warm.   Vitals and nursing note reviewed.       UCC COURSE     There were no labs reviewed with this patient during the visit.    There were no x-rays reviewed with this patient during the visit.    No current facility-administered medications for this visit.       PROCEDURES     Procedures    MEDICAL DECISION MAKING     History, physical, labs/studies most consistent with elevated BP  as the diagnosis.    Chart Review:  Prior PCP, Specialist and/or ED notes reviewed today: Yes  Prior labs/images/studies reviewed today: Yes    Differential Diagnosis: uncontrolled BP       ASSESSMENT     Encounter Diagnosis   Name Primary?    Blood pressure check Yes            PLAN      PLAN:     VSS  Pt was recommended to check BP daily/log .   Pt was given education about BP fluctuance - ie pain and hunger may alter BP but baseline provides a better perspective on next step regarding BP treatment. Further more, pt will need blood work before any changes in BP  BP in clinic  consistent 140/80 manual checked 3 times.   Pt is on antihypertensive linisonpril 20mg / HTZ 12.5mg  also lisinopril 10mg  and HTZ 12.5mg    Referral to [facility] for further mgt of HTN.        No orders of the defined types were placed in this encounter.    Requested Prescriptions      No prescriptions requested or ordered in this encounter       Discussed results and diagnosis with patient/family.  Reviewed warning signs for worsening condition, as well as, indications for follow-up with primary care physician and return to urgent care clinic.   Patient/family expressed understanding of instructions.     An After Visit Summary was provided to the patient.

## 2021-07-30 ENCOUNTER — Other Ambulatory Visit: Payer: Self-pay

## 2021-07-30 ENCOUNTER — Ambulatory Visit: Payer: 59 | Attending: Neurological Surgery | Admitting: Neurological Surgery

## 2021-07-30 ENCOUNTER — Encounter: Payer: Self-pay | Admitting: Neurological Surgery

## 2021-07-30 DIAGNOSIS — I729 Aneurysm of unspecified site: Secondary | ICD-10-CM | POA: Insufficient documentation

## 2021-07-30 NOTE — Progress Notes (Signed)
[facility]  New Patient Note    Referring MD:    Primary Care MD: Renaldo Harrison    MRN: [id number]    HPI     Chief Complaint   Patient presents with    Initial Consult     BRAIN ANEURYSM - CONSULTATION   THIRD OPINION   [facility] 05/09/21     HPI    Patient ID: Nicole Mcknight is a 56 y.o. {left/right:[id number]} handed female, DOB June 11, 1965.    Medical History     Past Medical History:   Diagnosis Date    Heart palpitations     been followed in past    Hypertensive disorder        Surgical History   No past surgical history on file.    Family History     Family History   Problem Relation Age of Onset    Hypertension Mother     Diabetes Father     Cancer Brother         lung ca    Pulmonary embolism Brother         Social History     Social History     Socioeconomic History    Marital status: Married   Tobacco Use    Smoking status: Never    Smokeless tobacco: Never   Vaping Use    Vaping Use: Never used   Substance and Sexual Activity    Alcohol use: Yes     Comment: social    Drug use: No       Current Medications     Current Outpatient Medications   Medication Sig Dispense Refill    albuterol sulfate HFA (PROVENTIL) 108 (90 Base) MCG/ACT inhaler albuterol sulfate HFA 90 mcg/actuation aerosol inhaler      aspirin EC 81 MG EC tablet Take 81 mg by mouth daily.      atenolol (TENORMIN) 25 MG tablet       butalbital-acetaminophen-caffeine (FIORICET, ESGIC) 50-325-40 MG per tablet Take 1 tablet by mouth every 6 (six) hours as needed for Headaches 15 tablet 0    Cholecalciferol (VITAMIN D) 2000 UNITS tablet Take 2,000 Units by mouth daily.      lisinopril-hydroCHLOROthiazide (ZESTORETIC) 10-12.5 MG per tablet Take 1 tablet by mouth      lisinopril-hydroCHLOROthiazide (ZESTORETIC) 20-12.5 MG per tablet lisinopril 20 mg-hydrochlorothiazide 12.5 mg tablet   TAKE 1 TABLET BY MOUTH ONCE DAILY AS DIRECTED      methIMAzole (TAPAZOLE) 10 MG tablet       Multiple Vitamins-Minerals (MULTIVITAMIN PO) Take by mouth.       sertraline (ZOLOFT) 25 MG tablet Take 1 tablet by mouth daily       No current facility-administered medications for this visit.       Allergies   No Known Allergies    Review of Systems   Review of Systems    Physical Examination   VITAL SIGNS:   height is 1.676 m (5\' 6" ) and weight is 108.2 kg (238 lb 9.6 oz). Her blood pressure is 153/91 (abnormal) and her pulse is 65. Her respiration is 18.          General:  Well developed, well nourished, no apparent distress  Neck:  Supple, no JVD, no apparent lymphadenopathy  HEENT:  Head normocephalic, atraumatic, no obvious lesions in ear, nose or throat  Chest:  Equal chest rise.  No wheezes, rales or rhonchi.  Skin:  No obvious lesions or scars  Extremities:  Without clubbing or cyanosis    Neurologic Exam    Radiology Interpretation   IR - Angiogram Cerebral (by Neuro)    Anatomical Region Laterality Modality   Body -- Other     Narrative    Procedure:   Diagnostic Cerebral Angiogram     Operator/Treating Physician: Lanette HampshireBenny Kim, MD   Assistant: April Elsie Staineid [Rad RN]; Cammie SickleWinston Stewart [Rad Tech].   Clinical Information:  56 y.o. female with PMH of HTN, OSA, p/w vertigo   and found to have an aneurysm upon work up. CTH: negative. CTA: ? 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 Pt presents for a cerebral angiogram.   Consent: The benefits, alternatives, and risks of the procedure including   but not limited to ischemic stroke, brain hemorrhage, infection, vessel   dissection, pseudo aneurysm, retroperitoneal hemorrhage, groin hematoma,   renal failure, radiation injury, hair loss, contrast allergy, other   unforeseen complications, and death were discussed with the patient in    detail. All questions were answered. The patient understood and agreed to   proceed.   Anesthesia:  I provided monitored sedation during this procedure, with a   face to face time of 50 minutes.  2 mg of IV Versed and 125 mcg of IV   Fentanyl were administered during the procedure and the patient was   continuously monitored by a trained independent observer.   Room time: 2 hours   Technique/findings:  Patient was brought to the angiography suite and   placed in supine position. Patient's groins were prepped and draped in   standard fashion. The right common femoral artery was palpated and had a   good pulse. The artery was accessed with a 726F micropuncture kit with a   single-wall puncture after providing local anesthesia with 1% lidocaine.   The 726F sheath was exchanged for a 5 French sheath over a guidewire. The 5   French short sheath was then connected to a continuous heparinized saline   flush.   Right common femoral artery: Pelvic view   Under fluoroscopic guidance, angiography was performed over the right   common femoral artery. Pelvic view of the right common femoral artery in   the right anterior oblique projection demonstrates a normal right common   femoral artery, right superficial femoral artery and right deep femoral   artery. The point of entry of the sheath is above the femoral bifurcation.   There is no significant stenosis or dissection. There is no aneurysm or AV   fistula.   Diagnostic Angiogram:   Through the groin sheath a 47F Impress diagnostic catheter was advanced   into the abdominal and thoracic aorta over a 0.035 inch Terumo guidewire.     The Impress catheter was cleaned in the thoracic aorta and advanced over   the aortic arch.   Right internal carotid artery: Intracranial view   Under fluoroscopic guidance the catheter was advanced into the right   internal carotid artery and biplane angiography was performed over the   cranium. 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  The right middle cerebral is   visualized and there is an aneurysm at the proximal inferior M2 branch   measuring 3.2 mm (transverse) x 1.37 mm (AP) x 3.0 mm (craniocaudal), with   a 2.94 mm neck. The aneurysm is incorporated into the inferior M2 vessel.     The right posterior communicating artery is not opacified.  The anterior   cerebral artery is not opacified.  There is no dissection. There are no   arteriovenous malformations or arteriovenous fistulas.  Capillary and   venous phases are unremarkable.   Left vertebral artery: Intracranial view   Under fluoroscopic guidance the catheter was advanced into the left   vertebral artery and biplane angiography was performed over the cranium.     Intracranial views of the left vertebral artery in the AP and lateral   projections demonstrate unremarkable V3 and V4 segments of the right   vertebral artery. The basilar artery is visualized and is unremarkable.     The bilateral posterior cerebral arteries are faintly visualized and are   unremarkable.  The bilateral superior cerebellar arteries are visualized   and are unremarkable.  The bilateral anterior inferior cerebellar arteries   are visualized and are unremarkable.   The left posterior inferior   cerebellar artery is visualized and is unremarkable.  There is no reflux   of contrast into the contralateral vertebral artery. There is no stenosis   or dissection.  There are no aneurysms, arteriovenous malformations, or   arteriovenous fistulas.  Capillary and venous phases are unremarkable.     The patient has a bovine arch.  The Impress catheter was removed from the   body and a Simmons 2 catheter was prepared and shaped into a Simmons 2   shape in the left subclavian artery.     Left common carotid artery:  Intracranial view   With the catheter positioned in the distal left common carotid artery,   biplane angiography was performed over the cranium. The intracranial view   of the left internal carotid artery in the AP and lateral projections   demonstrates an unremarkable left internal carotid artery distal to the   carotid bulb.  The left middle cerebral artery is visualized and is   unremarkable.  The left posterior communicating artery is visualized and   is unremarkable.  The anterior cerebral artery is visualized and is   unremarkable.  The anterior communicating is visualized and there is   opacification of the contralateral anterior cerebral artery.  The branches   of the external carotid artery are visualized and are unremarkable.  There   is no dissection. There are no aneurysms, arteriovenous malformations, or   arteriovenous fistulas.  Capillary and venous phases are unremarkable.   Left internal carotid artery: Intracranial view   Under fluoroscopic guidance the catheter was advanced into the left   internal carotid artery and biplane angiography was performed over the   cranium. The intracranial views of the left internal carotid artery in the   AP and lateral projections and 3D reconstructions demonstrates a saccular   shaped aneurysm at the terminal segment of the left ICA measuring 2.96 mm   (transverse) x 3.32 mm (AP) x 4.04 mm (craniocaudal), with a 2.56 mm neck.    There is a bilobed shaped aneurysm at the communicating segment of the  left ICA measuring 3.86 mm (transverse) x 4.55 mm (AP) x 2.43 mm   (craniocaudal), with a 3.18 mm neck. The posterior communicating artery   originates off a dome of the aneurysm.  The left middle cerebral artery is   visualized and is unremarkable.  The left anterior cerebral artery is   visualized and is unremarkable. The anterior communicating artery is   visualized and there is opacification of the contralateral anterior   cerebral artery.  There is a wide neck  aneurysm at the anterior   communicating artery measuring 4.69 mm (transverse) x 1.96 mm (AP) x 5.05   mm (craniocaudal).  There is no dissection. There are no arteriovenous   malformations or arteriovenous fistulas.  Capillary and venous phases are   unremarkable.   Upon completion of the procedure, the 5 French sheath was removed using a   97F Mynx closure device to obtain hemostasis. The neurological exam was   unchanged from the pre-procedure exam.  The findings were reviewed with   the patient's family.  All questions were answered.  I, Dr. Selena Batten was   present for the entire procedure.   Impression:   Selective injection of the right ICA demonstrates an aneurysm at the   proximal inferior M2 branch measuring 3.2 mm (transverse) x 1.37 mm (AP) x   3.0 mm (craniocaudal), with a 2.94 mm neck. The aneurysm is incorporated   into the inferior M2 vessel.   There is a second wide neck aneurysm at the anterior communicating artery   measuring 4.69 mm (transverse) x 1.96 mm (AP) x 5.05 mm (craniocaudal).   There is a third saccular shaped aneurysm at the terminal segment of the   left ICA measuring 2.96 mm (transverse) x 3.32 mm (AP) x 4.04 mm   (craniocaudal), with a 2.56 mm neck.   There is a forth bilobed shaped aneurysm at the communicating segment of   the left ICA measuring 3.86 mm (transverse) x 4.55 mm (AP) x 2.43 mm   (craniocaudal), with a 3.18 mm neck. The posterior communicating artery   originates off a dome of the aneurysm.  Clinical correlation is   recommended.  Exam End: 03/12/21 19:46       Impression         Follow-up     Marlaine Hind, MD

## 2021-08-12 ENCOUNTER — Encounter: Payer: Self-pay | Admitting: Neurological Surgery

## 2021-08-13 ENCOUNTER — Telehealth: Payer: Self-pay

## 2021-08-13 NOTE — Telephone Encounter (Signed)
Patient looking for endocrinologist, provided her with number to [facility].

## 2021-08-21 ENCOUNTER — Encounter: Payer: Self-pay | Admitting: Neurological Surgery

## 2021-08-21 ENCOUNTER — Ambulatory Visit: Payer: 59 | Attending: Neurological Surgery | Admitting: Neurological Surgery

## 2021-08-21 DIAGNOSIS — I671 Cerebral aneurysm, nonruptured: Secondary | ICD-10-CM | POA: Insufficient documentation

## 2021-08-21 NOTE — Progress Notes (Signed)
[facility]  Follow-up Note    Date: 08/21/2021  Patient Name: Nicole Mcknight, Nicole Mcknight   [id number]  Patient Care Team:  Renaldo Harrison, MD as PCP - General (Internal Medicine)    History of Present Illness:     Nicole Mcknight is a 56 y.o. female who presents for input regarding her multiple brain aneurysms.      Patient reports no definite family history of cerebral aneurysm. Her maternal aunt may have passed away from an aneurysm. She has a history of smoking as a young woman, but she has not smoked for decades.     History was obtained from chart review and the patient.    Past Medical History:     Past Medical History:   Diagnosis Date    Heart palpitations     been followed in past    Hypertensive disorder        Past Surgical History:     History reviewed. No pertinent surgical history.    Family History:     Family History   Problem Relation Age of Onset    Hypertension Mother     Diabetes Father     Cancer Brother         lung ca    Pulmonary embolism Brother        Social History:     Social History     Socioeconomic History    Marital status: Married     Spouse name: None    Number of children: None    Years of education: None    Highest education level: None   Occupational History    None   Tobacco Use    Smoking status: Never    Smokeless tobacco: Never   Vaping Use    Vaping Use: Never used   Substance and Sexual Activity    Alcohol use: Yes     Comment: social    Drug use: No    Sexual activity: None   Other Topics Concern    None   Social History Narrative    None     Social Determinants of Health     Financial Resource Strain: Not on file   Food Insecurity: Not on file   Transportation Needs: Not on file   Physical Activity: Not on file   Stress: Not on file   Social Connections: Not on file   Intimate Partner Violence: Not on file   Housing Stability: Not on file       Allergies:     Allergies   Allergen Reactions    Mixed Grasses        Medications:     Current Outpatient  Medications on File Prior to Visit   Medication Sig Dispense Refill    albuterol sulfate HFA (PROVENTIL) 108 (90 Base) MCG/ACT inhaler albuterol sulfate HFA 90 mcg/actuation aerosol inhaler      atenolol (TENORMIN) 25 MG tablet       Cholecalciferol (VITAMIN D) 2000 UNITS tablet Take 2,000 Units by mouth daily.      lisinopril-hydroCHLOROthiazide (ZESTORETIC) 10-12.5 MG per tablet Take 1 tablet by mouth      lisinopril-hydroCHLOROthiazide (ZESTORETIC) 20-12.5 MG per tablet lisinopril 20 mg-hydrochlorothiazide 12.5 mg tablet   TAKE 1 TABLET BY MOUTH ONCE DAILY AS DIRECTED      methIMAzole (TAPAZOLE) 10 MG tablet 5 mg 1/2 tab daily      Multiple Vitamins-Minerals (MULTIVITAMIN PO) Take by mouth.      sertraline (  ZOLOFT) 25 MG tablet Take 1 tablet by mouth daily      [DISCONTINUED] aspirin EC 81 MG EC tablet Take 81 mg by mouth daily.      [DISCONTINUED] butalbital-acetaminophen-caffeine (FIORICET, ESGIC) 50-325-40 MG per tablet Take 1 tablet by mouth every 6 (six) hours as needed for Headaches 15 tablet 0     No current facility-administered medications on file prior to visit.         Review of Systems:        Vital Signs:     Vitals:    08/21/21 1422   BP: 115/77   Pulse: 66   Resp: 16   Temp: 98.1 F (36.7 C)       Physical Exam:      General: The patient was well developed and well nourished.      In acute distress, talking about separation from her 36 year marriage.     Neck: Trachea midline  Pulmonary: normal respiratory effort.   Cardiovascular: no diaphoresis to forehead  Extremities: hands normal in color  Skin: no rash to visible skin  Mental Status: The patient is awake, alert and oriented to person, place, and time.    Affect is normal. Fund of knowledge appropriate.   Recent and remote memory are intact. Attention span and concentration appear normal.  No delusions or hallucinations. Language function is normal.   There is no evidence of aphasia in conversational speech.  Cranial nerves:   -CN II: Visual  fields full to confrontation   -CN III, IV, VI: Pupils equal, round, and reactive to light; extraocular movements intact; no ptosis                                          -CN VII: Face symmetric   -CN VIII: Hearing intact to conversational speech   -CN IX, X:  normal phonation   -CN XI: Symmetric trapezius muscles   Motor: Muscle tone normal without spasticity or flaccidity. No atrophy.    BUE/BLE 5/5  Coordination: No tremors  Gait: Station normal, gait stable   GCS 15          Labs:     Lab Results   Component Value Date    WBC 7.48 03/03/2018    HGB 13.3 03/03/2018    HCT 42.4 03/03/2018    MCV 94.2 03/03/2018    PLT 265 03/03/2018     Lab Results   Component Value Date    NA 139 03/03/2018    K 3.7 03/03/2018    CL 103 03/03/2018    CO2 26 03/03/2018     Lab Results   Component Value Date    INR 1.1 06/12/2015    PT 14.1 06/12/2015     Lab Results   Component Value Date    BUN 11.0 03/03/2018     Lab Results   Component Value Date    CREAT 0.8 03/03/2018         Imaging:     I personally reviewed the imaging with the patient.      Cerebral angiogram  Impression:   Selective injection of the right ICA demonstrates an aneurysm at the   proximal inferior M2 branch measuring 3.2 mm (transverse) x 1.37 mm (AP) x   3.0 mm (craniocaudal), with a 2.94 mm neck. The aneurysm is incorporated   into the inferior M2  vessel.   There is a second wide neck aneurysm at the anterior communicating artery   measuring 4.69 mm (transverse) x 1.96 mm (AP) x 5.05 mm (craniocaudal).   There is a third saccular shaped aneurysm at the terminal segment of the   left ICA measuring 2.96 mm (transverse) x 3.32 mm (AP) x 4.04 mm   (craniocaudal), with a 2.56 mm neck.   There is a forth bilobed shaped aneurysm at the communicating segment of   the left ICA measuring 3.86 mm (transverse) x 4.55 mm (AP) x 2.43 mm   (craniocaudal), with a 3.18 mm neck. The posterior communicating artery   originates off a dome of the aneurysm.  Clinical  correlation is   recommended.  Exam End: 03/12/21 19:46 Last Resulted: 03/12/21 20:59   [facility]  Result Received: 07/29/21 19:07          Impression & Plan   56 y.o. female with multiple intracranial aneurysms.    [facility].  I was able to review these images myself.    She has 4 intracranial aneurysms in total.      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  This aneurysm can be monitored with serial imaging.    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    Due to the irregular shape of these aneurysms, I believe these do warrant treatment.  We discussed endovascular treatment as well as open craniotomy and clipping.  I believe coil embolization is less safe due to the involvement of the anterior choroidal artery.      As a result, open craniotomy and clipping appears to be the most favorable option.  Should the clipping of the ICA aneurysms be uncomplicated, the anterior communicating artery complex can be explored as well.      We discussed all risks, benefits, and alternatives in detail.  She will think about these options with her family.  Should she decide for surveillance, she will need imaging 03/2022.      No orders of the defined types were placed in this encounter.    Requested Prescriptions      No prescriptions requested or ordered in this encounter       Follow up:   Follow-up for further surgical counseling or routine post op care       Peytan Andringa Hart RochesterV Cortlandt Capuano, MD  Cristie Nash ShearerMay N Brewer, NP    I, Cristie May N Brewer, NP was acting scribe for provider Binnie RailAmeet V Carmisha Larusso, MD on patient Dannial Monarchatricia A Kurdziel.    I, Epifanio Labrador Hart RochesterV Ayodeji Keimig, MD personally  performed the services documented. Cristie Nash ShearerMay N Brewer, NP is scribing for me on patient Dannial Monarchatricia A Monier. This note and the patient instructions accurately reflect work and decisions made by me, Binnie RailAmeet V Jax Kentner, MD.    We have spent over 50% of the visit counseling in one or more of the following areas:  [1] diagnostic test results, impressions, or recommended studies [2] prognosis, [3] risks of monitoring versus treatment of the cerebral aneurysms; benefits, and surgical alternatives of treatment options of multiple irregularly shaped cerebral aneurysm.

## 2021-11-12 NOTE — Progress Notes (Incomplete)
[facility]  Follow up Note    Previous Impression/Plan   Dr. Chauncy Lean (08/21/21)     She underwent cerebral angiography last fall at [facility].  I was able to review these images myself.     She has 4 intracranial aneurysms in total.       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  This aneurysm can be monitored with serial imaging.     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     Due to the irregular shape of these aneurysms, I believe these do warrant treatment.  We discussed endovascular treatment as well as open craniotomy and clipping.  I believe coil embolization is less safe due to the involvement of the anterior choroidal artery.       As a result, open craniotomy and clipping appears to be the most favorable option.  Should the clipping of the ICA aneurysms be uncomplicated, the anterior communicating artery complex can be explored as well.     HPI   No chief complaint on file.      Nicole Mcknight 56 y.o. female with mutiple aneurysms presents today for an additional opinions. She has seen Dr. Deloria Lair, Dr. Chauncy Lean, Dr. Lanette Hampshire and Dr. Jed Limerick. She had a cerebral angiogram at [facility].       Physical Examination   VITAL SIGNS:   There were no vitals filed for this visit.       Neurologic Exam    Mental Status    Alert   Speech clear      Cranial Nerves      CN III, IV, VI   Pupils are equal, round, and reactive to light.  Extraocular motions are normal.      CN VII: Facial expression full, symmetric.     CN VIII: Hearing intact b/l    CN XI: Shoulder shrug symmetric    Motor Exam   Overall muscle tone: normal     Strength   BUE/BLE 5/5     Gait: normal    Review of Systems   Review of Systems  Constitutional:  negative for fever or chills.  HENT: Negative for tinnitus or rhinorrhea   Eyes: Negative for visual disturbance.   Musculoskeletal: Negative for gait problem. Negative for neck pain. Negative for back pain.   Skin: Negative for wounds.  Neurological: no history of seizures.  Respiratory: Negative for cough or wheezing  Endocrine: Negative for cold or heat intolerance   GI: Negative for constipation or diarrhea     Radiology Interpretation     Procedure:   Diagnostic Cerebral Angiogram     Operator/Treating Physician: Lanette Hampshire, MD   Assistant: April Elsie Stain RN]; Cammie Sickle [Rad Tech].   Clinical Information:  56 y.o. female with PMH of HTN, OSA, p/w vertigo   and found to have an aneurysm upon work up. CTH: negative. CTA: ? 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 Pt presents for a cerebral angiogram.   Consent: The benefits, alternatives, and risks of the procedure including   but not limited to ischemic stroke, brain hemorrhage, infection, vessel   dissection, pseudo aneurysm, retroperitoneal hemorrhage, groin hematoma,   renal failure, radiation injury, hair loss, contrast allergy, other   unforeseen complications, and death were discussed with the patient in   detail. All questions were answered. The patient understood and agreed to   proceed.   Anesthesia:  I provided monitored sedation during this procedure, with a   face to face time of 50 minutes.  2 mg of IV Versed and 125 mcg of IV   Fentanyl were administered during the procedure and the patient was   continuously monitored by a trained independent observer.   Room time: 2 hours   Technique/findings:  Patient was brought to the angiography suite and    placed in supine position. Patient's groins were prepped and draped in   standard fashion. The right common femoral artery was palpated and had a   good pulse. The artery was accessed with a 38F micropuncture kit with a   single-wall puncture after providing local anesthesia with 1% lidocaine.   The 38F sheath was exchanged for a 5 French sheath over a guidewire. The 5   French short sheath was then connected to a continuous heparinized saline   flush.   Right common femoral artery: Pelvic view   Under fluoroscopic guidance, angiography was performed over the right   common femoral artery. Pelvic view of the right common femoral artery in   the right anterior oblique projection demonstrates a normal right common   femoral artery, right superficial femoral artery and right deep femoral   artery. The point of entry of the sheath is above the femoral bifurcation.   There is no significant stenosis or dissection. There is no aneurysm or AV   fistula.   Diagnostic Angiogram:   Through the groin sheath a 67F Impress diagnostic catheter was advanced   into the abdominal and thoracic aorta over a 0.035 inch Terumo guidewire.     The Impress catheter was cleaned in the thoracic aorta and advanced over   the aortic arch.   Right internal carotid artery: Intracranial view   Under fluoroscopic guidance the catheter was advanced into the right   internal carotid artery and biplane angiography was performed over the   cranium. 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  The right middle cerebral is   visualized and there is an aneurysm at the proximal inferior M2 branch   measuring 3.2 mm (transverse) x 1.37 mm (AP) x 3.0 mm (craniocaudal), with   a 2.94 mm neck. The aneurysm is incorporated into the inferior M2 vessel.      The right posterior communicating artery is not opacified.  The anterior   cerebral artery is not opacified.  There is no dissection. There are no   arteriovenous malformations or arteriovenous fistulas.  Capillary and   venous phases are unremarkable.   Left vertebral artery: Intracranial view   Under fluoroscopic guidance the catheter was advanced into the left   vertebral artery and biplane angiography was performed over the cranium.  Intracranial views of the left vertebral artery in the AP and lateral   projections demonstrate unremarkable V3 and V4 segments of the right   vertebral artery. The basilar artery is visualized and is unremarkable.     The bilateral posterior cerebral arteries are faintly visualized and are   unremarkable.  The bilateral superior cerebellar arteries are visualized   and are unremarkable.  The bilateral anterior inferior cerebellar arteries   are visualized and are unremarkable.   The left posterior inferior   cerebellar artery is visualized and is unremarkable.  There is no reflux   of contrast into the contralateral vertebral artery. There is no stenosis   or dissection.  There are no aneurysms, arteriovenous malformations, or   arteriovenous fistulas.  Capillary and venous phases are unremarkable.     The patient has a bovine arch.  The Impress catheter was removed from the   body and a Simmons 2 catheter was prepared and shaped into a Simmons 2   shape in the left subclavian artery.     Left common carotid artery: Intracranial view   With the catheter positioned in the distal left common carotid artery,   biplane angiography was performed over the cranium. The intracranial view   of the left internal carotid artery in the AP and lateral projections   demonstrates an unremarkable left internal carotid artery distal to the   carotid bulb.  The left middle cerebral artery is visualized and is   unremarkable.  The left posterior communicating artery is visualized and   is  unremarkable.  The anterior cerebral artery is visualized and is   unremarkable.  The anterior communicating is visualized and there is   opacification of the contralateral anterior cerebral artery.  The branches   of the external carotid artery are visualized and are unremarkable.  There   is no dissection. There are no aneurysms, arteriovenous malformations, or   arteriovenous fistulas.  Capillary and venous phases are unremarkable.   Left internal carotid artery: Intracranial view   Under fluoroscopic guidance the catheter was advanced into the left   internal carotid artery and biplane angiography was performed over the   cranium. The intracranial views of the left internal carotid artery in the   AP and lateral projections and 3D reconstructions demonstrates a saccular   shaped aneurysm at the terminal segment of the left ICA measuring 2.96 mm   (transverse) x 3.32 mm (AP) x 4.04 mm (craniocaudal), with a 2.56 mm neck.    There is a bilobed shaped aneurysm at the communicating segment of the   left ICA measuring 3.86 mm (transverse) x 4.55 mm (AP) x 2.43 mm   (craniocaudal), with a 3.18 mm neck. The posterior communicating artery   originates off a dome of the aneurysm.  The left middle cerebral artery is   visualized and is unremarkable.  The left anterior cerebral artery is   visualized and is unremarkable. The anterior communicating artery is   visualized and there is opacification of the contralateral anterior   cerebral artery.  There is a wide neck aneurysm at the anterior   communicating artery measuring 4.69 mm (transverse) x 1.96 mm (AP) x 5.05   mm (craniocaudal).  There is no dissection. There are no arteriovenous   malformations or arteriovenous fistulas.  Capillary and venous phases are   unremarkable.   Upon completion of the procedure, the 5 French sheath was removed using a   35F Mynx closure device to  obtain hemostasis. The neurological exam was   unchanged from the pre-procedure exam.  The  findings were reviewed with   the patient's family.  All questions were answered.  I, Dr. Selena Batten was   present for the entire procedure.   Impression:   Selective injection of the right ICA demonstrates an aneurysm at the   proximal inferior M2 branch measuring 3.2 mm (transverse) x 1.37 mm (AP) x   3.0 mm (craniocaudal), with a 2.94 mm neck. The aneurysm is incorporated   into the inferior M2 vessel.   There is a second wide neck aneurysm at the anterior communicating artery   measuring 4.69 mm (transverse) x 1.96 mm (AP) x 5.05 mm (craniocaudal).   There is a third saccular shaped aneurysm at the terminal segment of the   left ICA measuring 2.96 mm (transverse) x 3.32 mm (AP) x 4.04 mm   (craniocaudal), with a 2.56 mm neck.   There is a forth bilobed shaped aneurysm at the communicating segment of   the left ICA measuring 3.86 mm (transverse) x 4.55 mm (AP) x 2.43 mm   (craniocaudal), with a 3.18 mm neck. The posterior communicating artery   originates off a dome of the aneurysm.  Clinical correlation is   recommended.  Exam End: 03/12/21 19:46 Last Resulted: 03/12/21 20:59       The images above were personally reviewed and discussed in detail with the patient.       Impression   56 y.o. female      Plan       Follow-up       Malachi Carl, NP   Levie Heritage, MD    All relevant and clinical information was transcribed by me, Malachi Carl, NP, acting as a scribe for Dr. Erasmo Score.

## 2021-11-13 ENCOUNTER — Ambulatory Visit: Payer: 59 | Admitting: Neurological Surgery

## 2021-11-13 NOTE — Progress Notes (Signed)
This encounter was created in error - please disregard.    Items noted as "reviewed" are for administrative purposes only and are not guaranteed by the provider to be accurate on this date.

## 2021-12-12 ENCOUNTER — Other Ambulatory Visit (INDEPENDENT_AMBULATORY_CARE_PROVIDER_SITE_OTHER): Payer: Self-pay | Admitting: "Endocrinology

## 2022-01-29 ENCOUNTER — Ambulatory Visit: Payer: 59 | Attending: Neurological Surgery | Admitting: Neurological Surgery

## 2022-01-29 DIAGNOSIS — I671 Cerebral aneurysm, nonruptured: Secondary | ICD-10-CM | POA: Insufficient documentation

## 2022-01-29 NOTE — Progress Notes (Signed)
[facility]surgery  Follow-up Note    Date: 01/29/2022  Patient Name: Nicole Mcknight, Nicole Mcknight   [id number]  Patient Care Team:  Renaldo Harrison, MD as PCP - General (Internal Medicine)    History of Present Illness:     Nicole Mcknight is a 56 y.o. female who presents for input regarding her multiple brain aneurysms.      Patient reports no definite family history of cerebral aneurysm. Her maternal aunt may have passed away from an aneurysm. She has a history of smoking as a young woman, but she has not smoked for decades.     She has been seen by Dr. Lanette Hampshire and Dr. Thomasene Lot as well.    She is a Scientist, product/process development.     History was obtained from chart review and the patient.    Past Medical History:     Past Medical History:   Diagnosis Date    Heart palpitations     been followed in past    Hypertensive disorder        Past Surgical History:     No past surgical history on file.    Family History:     Family History   Problem Relation Age of Onset    Hypertension Mother     Diabetes Father     Cancer Brother         lung ca    Pulmonary embolism Brother        Social History:     Social History     Socioeconomic History    Marital status: Married     Spouse name: Not on file    Number of children: Not on file    Years of education: Not on file    Highest education level: Not on file   Occupational History    Not on file   Tobacco Use    Smoking status: Never    Smokeless tobacco: Never   Vaping Use    Vaping Use: Never used   Substance and Sexual Activity    Alcohol use: Yes     Comment: social    Drug use: No    Sexual activity: Not on file   Other Topics Concern    Not on file   Social History Narrative    Not on file     Social Determinants of Health     Financial Resource Strain: Low Risk  (01/27/2022)    Overall Financial Resource Strain (CARDIA)     Difficulty of Paying Living Expenses: Not hard at all   Food Insecurity: No Food Insecurity (01/27/2022)    Hunger Vital Sign     Worried About  Running Out of Food in the Last Year: Never true     Ran Out of Food in the Last Year: Never true   Transportation Needs: No Transportation Needs (01/27/2022)    PRAPARE - Therapist, art (Medical): No     Lack of Transportation (Non-Medical): No   Physical Activity: Insufficiently Active (01/27/2022)    Exercise Vital Sign     Days of Exercise per Week: 3 days     Minutes of Exercise per Session: 10 min   Stress: No Stress Concern Present (01/27/2022)    [employer] of Occupational Health - Occupational Stress Questionnaire     Feeling of Stress : Only a little   Social Connections: Socially Integrated (01/27/2022)    Social Connection and Isolation  Panel [NHANES]     Frequency of Communication with Friends and Family: More than three times a week     Frequency of Social Gatherings with Friends and Family: More than three times a week     Attends Religious Services: More than 4 times per year     Active Member of [employer] or Organizations: Yes     Attends Banker Meetings: More than 4 times per year     Marital Status: Married   Catering manager Violence: Not At Risk (01/27/2022)    Humiliation, Afraid, Rape, and Kick questionnaire     Fear of Current or Ex-Partner: No     Emotionally Abused: No     Physically Abused: No     Sexually Abused: No   Housing Stability: Low Risk  (01/27/2022)    Housing Stability Vital Sign     Unable to Pay for Housing in the Last Year: No     Number of Places Lived in the Last Year: 2     Unstable Housing in the Last Year: No       Allergies:     Allergies   Allergen Reactions    Mixed Grasses        Medications:     Current Outpatient Medications on File Prior to Visit   Medication Sig Dispense Refill    albuterol sulfate HFA (PROVENTIL) 108 (90 Base) MCG/ACT inhaler albuterol sulfate HFA 90 mcg/actuation aerosol inhaler      atenolol (TENORMIN) 25 MG tablet       Cholecalciferol (VITAMIN D) 2000 UNITS tablet Take 2,000 Units by mouth daily.       lisinopril-hydroCHLOROthiazide (ZESTORETIC) 10-12.5 MG per tablet Take 1 tablet by mouth      lisinopril-hydroCHLOROthiazide (ZESTORETIC) 20-12.5 MG per tablet lisinopril 20 mg-hydrochlorothiazide 12.5 mg tablet   TAKE 1 TABLET BY MOUTH ONCE DAILY AS DIRECTED      methIMAzole (TAPAZOLE) 10 MG tablet 5 mg 1/2 tab daily      Multiple Vitamins-Minerals (MULTIVITAMIN PO) Take by mouth.      sertraline (ZOLOFT) 25 MG tablet Take 1 tablet by mouth daily       No current facility-administered medications on file prior to visit.         Review of Systems:        Vital Signs:     There were no vitals filed for this visit.      Physical Exam:      General: The patient was well developed and well nourished.      In acute distress, talking about separation from her 21 year marriage.     Neck: Trachea midline  Pulmonary: normal respiratory effort.   Cardiovascular: no diaphoresis to forehead  Extremities: hands normal in color  Skin: no rash to visible skin  Mental Status: The patient is awake, alert and oriented to person, place, and time.    Affect is normal. Fund of knowledge appropriate.   Recent and remote memory are intact. Attention span and concentration appear normal.  No delusions or hallucinations. Language function is normal.   There is no evidence of aphasia in conversational speech.  Cranial nerves:   -CN II: Visual fields full to confrontation   -CN III, IV, VI: Pupils equal, round, and reactive to light; extraocular movements intact; no ptosis                                          -  CN VII: Face symmetric   -CN VIII: Hearing intact to conversational speech   -CN IX, X:  normal phonation   -CN XI: Symmetric trapezius muscles   Motor: Muscle tone normal without spasticity or flaccidity. No atrophy.    BUE/BLE 5/5  Coordination: No tremors  Gait: Station normal, gait stable   GCS 15          Labs:     Lab Results   Component Value Date    WBC 7.48 03/03/2018    HGB 13.3 03/03/2018    HCT 42.4 03/03/2018    MCV  94.2 03/03/2018    PLT 265 03/03/2018     Lab Results   Component Value Date    NA 139 03/03/2018    K 3.7 03/03/2018    CL 103 03/03/2018    CO2 26 03/03/2018     Lab Results   Component Value Date    INR 1.1 06/12/2015    PT 14.1 06/12/2015     Lab Results   Component Value Date    BUN 11.0 03/03/2018     Lab Results   Component Value Date    CREAT 0.8 03/03/2018         Imaging:     I personally reviewed the imaging with the patient.      Cerebral angiogram  Impression:   Selective injection of the right ICA demonstrates an aneurysm at the   proximal inferior M2 branch measuring 3.2 mm (transverse) x 1.37 mm (AP) x   3.0 mm (craniocaudal), with a 2.94 mm neck. The aneurysm is incorporated   into the inferior M2 vessel.   There is a second wide neck aneurysm at the anterior communicating artery   measuring 4.69 mm (transverse) x 1.96 mm (AP) x 5.05 mm (craniocaudal).   There is a third saccular shaped aneurysm at the terminal segment of the   left ICA measuring 2.96 mm (transverse) x 3.32 mm (AP) x 4.04 mm   (craniocaudal), with a 2.56 mm neck.   There is a forth bilobed shaped aneurysm at the communicating segment of   the left ICA measuring 3.86 mm (transverse) x 4.55 mm (AP) x 2.43 mm   (craniocaudal), with a 3.18 mm neck. The posterior communicating artery   originates off a dome of the aneurysm.  Clinical correlation is   recommended.  Exam End: 03/12/21 19:46 Last Resulted: 03/12/21 20:59   [facility] From: [facility]  Result Received: 07/29/21 19:07          Impression & Plan   56 y.o. female with multiple intracranial aneurysms.    She underwent cerebral angiography last fall at [facility] and at [facility] more recently which was reported as stable.  I was able to review the older images.    She has 4 intracranial aneurysms in total.      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  This aneurysm  can be monitored with serial imaging.    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    Due to the irregular shape of these left sided aneurysms, I believe these do warrant treatment.  We discussed endovascular treatment as well as open craniotomy and clipping.  I believe coil embolization is less safe due to the involvement of the anterior choroidal artery.      As a result, open craniotomy and clipping appears to be the most favorable option.  Should the clipping of the ICA aneurysms be uncomplicated, the anterior communicating artery complex can be explored as well.      She plans to visit [facility] for their blood program since she is unable to receive blood transfusion.    We discussed all risks, benefits, and alternatives in detail.  She will think about these options and may return for surgical intervention.      No orders of the defined types were placed in this encounter.    Requested Prescriptions      No prescriptions requested or ordered in this encounter   No limitations / restrictions except avoid cocaine.    Follow up:   Follow-up for further surgical counseling or routine post op care       Breaker Springer Hart Rochester, MD  Cristie Renard Matter, NP    I, Babette Relic, NP was acting scribe for provider Binnie Rail, MD on patient Nicole Mcknight.    I, Tanganika Barradas Hart Rochester, MD personally performed the services documented. Babette Relic, NP is scribing for me on patient Nicole Mcknight. This note and the patient instructions accurately reflect work and decisions made by me, Binnie Rail, MD.

## 2022-02-12 ENCOUNTER — Ambulatory Visit: Payer: 59 | Admitting: Neurological Surgery

## 2022-11-11 ENCOUNTER — Other Ambulatory Visit: Payer: Self-pay | Admitting: Family

## 2022-11-11 ENCOUNTER — Ambulatory Visit
Admission: RE | Admit: 2022-11-11 | Discharge: 2022-11-11 | Disposition: A | Discharge status: Home / Self Care | Payer: No Typology Code available for payment source | Source: Ambulatory Visit | Attending: Family | Admitting: Family

## 2022-11-11 DIAGNOSIS — M25561 Pain in right knee: Secondary | ICD-10-CM

## 2022-12-26 NOTE — Progress Notes (Signed)
[facility]  Follow-up Note    Date: 12/31/2022  Patient Name: Nicole Mcknight, Nicole Mcknight   [id number]  Patient Care Team:  Lerry Liner, MD as PCP - General (Internal Medicine)    History of Present Illness:     ERANDI LEMMA is a 57 y.o. female who presents for further counseling regarding her multiple brain aneurysms.    Patient reports no definite family history of cerebral aneurysm. Her maternal aunt may have passed away from an aneurysm. She has a history of smoking as a young woman, but she has not smoked for decades.   She has been seen by Dr. Lanette Hampshire and Dr. Thomasene Lot as well.    At her last visit, we discussed the options of management and was recommended surgical clipping. 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     History was obtained from chart review and the patient.    Past Medical History:     Past Medical History:   Diagnosis Date    Heart palpitations     been followed in past    Hypertensive disorder        Past Surgical History:     History reviewed. No pertinent surgical history.    Family History:     Family History   Problem Relation Age of Onset    Hypertension Mother     Diabetes Father     Cancer Brother         lung ca    Pulmonary embolism Brother        Social History:     Social History     Socioeconomic History    Marital status: Married     Spouse name: None    Number of children: None    Years of education: None    Highest education level: None   Occupational History    None   Tobacco Use    Smoking status: Never    Smokeless tobacco: Never   Vaping Use    Vaping status: Never Used   Substance and Sexual Activity    Alcohol use: Not Currently     Comment: social    Drug use: No    Sexual activity: None   Other Topics Concern    None   Social History Narrative    None     Social Determinants of Health     Financial Resource Strain: Low Risk  (01/27/2022)    Overall Financial Resource Strain (CARDIA)     Difficulty of Paying Living  Expenses: Not hard at all   Food Insecurity: No Food Insecurity (01/27/2022)    Hunger Vital Sign     Worried About Running Out of Food in the Last Year: Never true     Ran Out of Food in the Last Year: Never true   Transportation Needs: No Transportation Needs (01/27/2022)    PRAPARE - Therapist, art (Medical): No     Lack of Transportation (Non-Medical): No   Physical Activity: Insufficiently Active (01/27/2022)    Exercise Vital Sign     Days of Exercise per Week: 3 days     Minutes of Exercise per Session: 10 min   Stress: No Stress Concern Present (01/27/2022)    [employer] of Occupational Health - Occupational Stress Questionnaire     Feeling of Stress : Only a little   Social Connections: Socially Integrated (01/27/2022)  Social Advertising account executive [NHANES]     Frequency of Communication with Friends and Family: More than three times a week     Frequency of Social Gatherings with Friends and Family: More than three times a week     Attends Religious Services: More than 4 times per year     Active Member of [employer] or Organizations: Yes     Attends Engineer, structural: More than 4 times per year     Marital Status: Married   Catering manager Violence: Not At Risk (01/27/2022)    Humiliation, Afraid, Rape, and Kick questionnaire     Fear of Current or Ex-Partner: No     Emotionally Abused: No     Physically Abused: No     Sexually Abused: No   Housing Stability: Low Risk  (01/27/2022)    Housing Stability Vital Sign     Unable to Pay for Housing in the Last Year: No     Number of Places Lived in the Last Year: 2     Unstable Housing in the Last Year: No       Allergies:     Allergies   Allergen Reactions    Mixed Grasses     Soy Allergy Itching       Medications:     Current Outpatient Medications on File Prior to Visit   Medication Sig Dispense Refill    albuterol sulfate HFA (PROVENTIL) 108 (90 Base) MCG/ACT inhaler albuterol sulfate HFA 90 mcg/actuation aerosol  inhaler      lisinopril-hydroCHLOROthiazide (ZESTORETIC) 20-12.5 MG per tablet lisinopril 20 mg-hydrochlorothiazide 12.5 mg tablet   TAKE 1 TABLET BY MOUTH ONCE DAILY AS DIRECTED      Multiple Vitamins-Minerals (MULTIVITAMIN PO) Take by mouth.      atenolol (TENORMIN) 25 MG tablet       Cholecalciferol (VITAMIN D) 2000 UNITS tablet Take 2,000 Units by mouth daily.      lisinopril-hydroCHLOROthiazide (ZESTORETIC) 10-12.5 MG per tablet Take 1 tablet by mouth      methIMAzole (TAPAZOLE) 10 MG tablet 5 mg 1/2 tab daily       No current facility-administered medications on file prior to visit.         Review of Systems:        Vital Signs:     Vitals:    12/31/22 1240   BP: 126/83   Pulse: (!) 54         Physical Exam:      General: The patient was well developed and well nourished.      In acute distress, talking about separation from her 88 year marriage.     Neck: Trachea midline  Pulmonary: normal respiratory effort.   Cardiovascular: no diaphoresis to forehead  Extremities: hands normal in color  Skin: no rash to visible skin  Mental Status: The patient is awake, alert and oriented to person, place, and time.    Affect is normal. Fund of knowledge appropriate.   Recent and remote memory are intact. Attention span and concentration appear normal.  No delusions or hallucinations. Language function is normal.   There is no evidence of aphasia in conversational speech.  Cranial nerves:   -CN II: Visual fields full to confrontation   -CN III, IV, VI: Pupils equal, round, and reactive to light; extraocular movements intact; no ptosis                                          -  CN VII: Face symmetric   -CN VIII: Hearing intact to conversational speech   -CN IX, X:  normal phonation   -CN XI: Symmetric trapezius muscles   Motor: Muscle tone normal without spasticity or flaccidity. No atrophy.    BUE/BLE 5/5  Coordination: No tremors  Gait: Station normal, gait stable   GCS 15          Labs:     Lab Results   Component Value  Date    WBC 7.48 03/03/2018    HGB 13.3 03/03/2018    HCT 42.4 03/03/2018    MCV 94.2 03/03/2018    PLT 265 03/03/2018     Lab Results   Component Value Date    NA 139 03/03/2018    K 3.7 03/03/2018    CL 103 03/03/2018    CO2 26 03/03/2018     Lab Results   Component Value Date    INR 1.1 06/12/2015    PT 14.1 06/12/2015     Lab Results   Component Value Date    BUN 11.0 03/03/2018     Lab Results   Component Value Date    CREAT 0.8 03/03/2018         Imaging:     I personally reviewed the imaging with the patient.      Cerebral angiogram  Impression:   Selective injection of the right ICA demonstrates an aneurysm at the   proximal inferior M2 branch measuring 3.2 mm (transverse) x 1.37 mm (AP) x   3.0 mm (craniocaudal), with a 2.94 mm neck. The aneurysm is incorporated   into the inferior M2 vessel.   There is a second wide neck aneurysm at the anterior communicating artery   measuring 4.69 mm (transverse) x 1.96 mm (AP) x 5.05 mm (craniocaudal).   There is a third saccular shaped aneurysm at the terminal segment of the   left ICA measuring 2.96 mm (transverse) x 3.32 mm (AP) x 4.04 mm   (craniocaudal), with a 2.56 mm neck.   There is a forth bilobed shaped aneurysm at the communicating segment of   the left ICA measuring 3.86 mm (transverse) x 4.55 mm (AP) x 2.43 mm   (craniocaudal), with a 3.18 mm neck. The posterior communicating artery   originates off a dome of the aneurysm.  Clinical correlation is   recommended.  Exam End: 03/12/21 19:46 Last Resulted: 03/12/21 20:59   Received From: [facility]  Result Received: 07/29/21 19:07          Impression & Plan   57 y.o. female with multiple intracranial aneurysms.    She underwent cerebral angiography last fall at [facility] and at [facility] more recently which was reported as stable.  I was able to review the older images.    She has 4 intracranial aneurysms in total.      There is a small, broad-based, shallow aneurysm of  the proximal right MCA, of which there is an early bifurcation.  This aneurysm can be monitored with serial imaging.    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    Due to the irregular shape of these left sided aneurysms, I believe these do warrant treatment.  We discussed endovascular treatment as well as open craniotomy and clipping.  I believe coil embolization is less safe due to the involvement of the anterior choroidal artery.      As a result, open craniotomy and clipping appears to be the most favorable option.  Should the clipping of the ICA aneurysms be uncomplicated, the anterior communicating artery complex can be explored as well.      We discussed all risks, benefits, and alternatives in detail.    She is prepared to proceed with treatment planning at this time  Will plan for angiogram in near future.      Orders Placed This Encounter   Procedures    Basic Metabolic Panel    CBC without Differential    IR Neuro General Case Request     Requested Prescriptions      No prescriptions requested or ordered in this encounter       Follow up:   rDSA on 01/12/23      Calleigh Lafontant Hart [place], MD  Cristie Renard Matter, NP    I, Babette Relic, NP was acting scribe for provider Binnie Rail, MD on patient Dannial Monarch.    I, Mekel Haverstock Hart Rochester, MD personally performed the services documented. Babette Relic, NP is scribing for me on patient NIKOLA BLACKSTON. This note and the patient instructions accurately reflect work and decisions made by me, Binnie Rail, MD.

## 2022-12-29 ENCOUNTER — Telehealth: Payer: Self-pay

## 2022-12-29 NOTE — Telephone Encounter (Signed)
Contacted Nicole Mcknight to confirm 12/31/22 12:30 PM appointment with Dr. Chauncy Lean. Patient confirmed appt. over the phone. Requested a call back if appointment reschedule is needed.     1:52 PM  12/29/22  [name], LPN

## 2022-12-31 ENCOUNTER — Encounter: Payer: Self-pay | Admitting: Neurological Surgery

## 2022-12-31 ENCOUNTER — Other Ambulatory Visit (FREE_STANDING_LABORATORY_FACILITY): Payer: Self-pay

## 2022-12-31 ENCOUNTER — Ambulatory Visit: Payer: No Typology Code available for payment source | Attending: Neurological Surgery | Admitting: Neurological Surgery

## 2022-12-31 VITALS — BP 126/83 | HR 54 | Ht 66.0 in | Wt 250.0 lb

## 2022-12-31 DIAGNOSIS — I671 Cerebral aneurysm, nonruptured: Secondary | ICD-10-CM

## 2022-12-31 LAB — BASIC METABOLIC PANEL
Anion Gap: 6 (ref 5.0–15.0)
BUN: 18 mg/dL (ref 7–21)
CO2: 27 mEq/L (ref 17–29)
Calcium: 9.2 mg/dL (ref 8.5–10.5)
Chloride: 108 mEq/L (ref 99–111)
Creatinine: 0.8 mg/dL (ref 0.4–1.0)
GFR: >60 mL/min/{1.73_m2} (ref >=60.0)
Glucose: 78 mg/dL (ref 70–100)
Hemolysis Index: 4 Index
Potassium: 4.2 mEq/L (ref 3.5–5.3)
Sodium: 141 mEq/L (ref 135–145)

## 2022-12-31 LAB — CBC
Absolute nRBC: 0 10*3/uL (ref <=0.00)
Hematocrit: 37.7 % (ref 34.7–43.7)
Hemoglobin: 11.9 g/dL (ref 11.4–14.8)
MCH: 29.5 pg (ref 25.1–33.5)
MCHC: 31.6 g/dL (ref 31.5–35.8)
MCV: 93.5 fL (ref 78.0–96.0)
MPV: 10.9 fL (ref 8.9–12.5)
Platelet Count: 243 10*3/uL (ref 142–346)
RBC: 4.03 10*6/uL (ref 3.90–5.10)
RDW: 13 % (ref 11–15)
WBC: 7.35 10*3/uL (ref 3.10–9.50)
nRBC %: 0 /100 WBC (ref <=0.0)

## 2023-01-08 ENCOUNTER — Encounter: Payer: Self-pay | Admitting: Nurse Practitioner

## 2023-01-08 ENCOUNTER — Encounter: Payer: Self-pay | Admitting: Neurological Surgery

## 2023-01-08 NOTE — Progress Notes (Addendum)
Neurosurgery plan of care note:    From an email, patient requested to cancel her angiogram due to ailing mother and insurance.  Patient noted that she will contact neurointerventional radiology scheduling when she is able to proceed with diagnostic angiogram.    Nicole Relic, NP

## 2023-01-12 DIAGNOSIS — I671 Cerebral aneurysm, nonruptured: Secondary | ICD-10-CM | POA: Insufficient documentation

## 2023-01-30 ENCOUNTER — Encounter: Payer: Self-pay | Admitting: Neurological Surgery

## 2023-01-30 NOTE — Progress Notes (Signed)
Pre-procedure phone call made. Patient instructed to arrive to diagnostic imaging at 0800. Patient will accompany with Son.      Patient's medication list, medical, surgical and social history updated. Patient instructed about NPO status, which medications to take morning of procedure, expectations of procedure, and recovery post-procedure.    Advised not take any multivitamins, herbal or other supplements day of your procedure.    You must have ride from family or friend who will drive you back after discharge. Benedetto Goad is not permitted.      Everything reviewed with patient, all questions answered. Patient verbalizes understanding of teaching.

## 2023-02-02 ENCOUNTER — Ambulatory Visit
Admission: RE | Admit: 2023-02-02 | Discharge: 2023-02-02 | Disposition: A | Discharge status: Home / Self Care | Payer: PRIVATE HEALTH INSURANCE | Source: Ambulatory Visit | Attending: Neurological Surgery | Admitting: Neurological Surgery

## 2023-02-02 ENCOUNTER — Encounter: Payer: Self-pay | Admitting: Neurological Surgery

## 2023-02-02 ENCOUNTER — Encounter
Admission: RE | Disposition: A | Discharge status: Home / Self Care | Payer: Self-pay | Source: Ambulatory Visit | Attending: Neurological Surgery

## 2023-02-02 DIAGNOSIS — Z48812 Encounter for surgical aftercare following surgery on the circulatory system: Secondary | ICD-10-CM | POA: Insufficient documentation

## 2023-02-02 DIAGNOSIS — I671 Cerebral aneurysm, nonruptured: Secondary | ICD-10-CM | POA: Diagnosis present

## 2023-02-02 DIAGNOSIS — Z8679 Personal history of other diseases of the circulatory system: Secondary | ICD-10-CM | POA: Insufficient documentation

## 2023-02-02 HISTORY — PX: NEURO ARTERIOGRAM: IMG2605

## 2023-02-02 SURGERY — NEURO ARTERIOGRAM
Anesthesia: Conscious Sedation | Site: Head | Laterality: Bilateral

## 2023-02-02 MED ORDER — SODIUM CHLORIDE 0.9 % IV SOLN
INTRAVENOUS | Status: DC
Start: 2023-02-02 — End: 2023-02-02

## 2023-02-02 MED ORDER — FLUMAZENIL 0.5 MG/5ML IV SOLN
0.2000 mg | INTRAVENOUS | Status: DC | PRN
Start: 2023-02-02 — End: 2023-02-02

## 2023-02-02 MED ORDER — LIDOCAINE HCL 1 % IJ SOLN
INTRAMUSCULAR | Status: AC
Start: 2023-02-02 — End: ?
  Filled 2023-02-02: qty 10

## 2023-02-02 MED ORDER — FENTANYL CITRATE (PF) 50 MCG/ML IJ SOLN (WRAP)
50.0000 ug | INTRAMUSCULAR | Status: DC | PRN
Start: 2023-02-02 — End: 2023-02-02
  Administered 2023-02-02: 50 ug via INTRAVENOUS
  Administered 2023-02-02 (×5): 25 ug via INTRAVENOUS

## 2023-02-02 MED ORDER — NALOXONE HCL 0.4 MG/ML IJ SOLN (WRAP)
0.1000 mg | INTRAMUSCULAR | Status: DC | PRN
Start: 2023-02-02 — End: 2023-02-02

## 2023-02-02 MED ORDER — ONDANSETRON HCL 4 MG/2ML IJ SOLN
4.0000 mg | Freq: Once | INTRAMUSCULAR | Status: DC
Start: 2023-02-02 — End: 2023-02-02

## 2023-02-02 MED ORDER — LIDOCAINE HCL 1 % IJ SOLN
INTRAMUSCULAR | Status: AC | PRN
Start: 2023-02-02 — End: 2023-02-02
  Administered 2023-02-02: 10 mL via INTRADERMAL

## 2023-02-02 MED ORDER — IOHEXOL 240 MG/ML IJ SOLN
INTRAMUSCULAR | Status: AC | PRN
Start: 2023-02-02 — End: 2023-02-02
  Administered 2023-02-02: 140 mL via INTRA_ARTERIAL

## 2023-02-02 MED ORDER — MIDAZOLAM HCL 1 MG/ML IJ SOLN (WRAP)
INTRAMUSCULAR | Status: AC
Start: 2023-02-02 — End: ?
  Filled 2023-02-02: qty 4

## 2023-02-02 MED ORDER — MIDAZOLAM HCL 1 MG/ML IJ SOLN (WRAP)
2.0000 mg | INTRAMUSCULAR | Status: DC | PRN
Start: 2023-02-02 — End: 2023-02-02
  Administered 2023-02-02 (×3): 0.5 mg via INTRAVENOUS
  Administered 2023-02-02: 2 mg via INTRAVENOUS
  Administered 2023-02-02: 0.5 mg via INTRAVENOUS

## 2023-02-02 MED ORDER — SODIUM CHLORIDE 0.9 % IV SOLN: INTRAVENOUS | Status: DC

## 2023-02-02 MED ORDER — FENTANYL CITRATE (PF) 50 MCG/ML IJ SOLN (WRAP)
INTRAMUSCULAR | Status: AC
Start: 2023-02-02 — End: ?
  Filled 2023-02-02: qty 4

## 2023-02-02 MED ORDER — DIPHENHYDRAMINE HCL 50 MG/ML IJ SOLN
25.0000 mg | Freq: Once | INTRAMUSCULAR | Status: DC | PRN
Start: 2023-02-02 — End: 2023-02-02

## 2023-02-02 SURGICAL SUPPLY — 18 items
CATHETER OD5 FR L100 CM BRAID SIM2 CURVE (Catheter) ×1 IMPLANT
CATHETER OD5 FR L100 CM BRAID SIM2 CURVE CEREBRAL STAINLESS STEEL (Catheter) ×1 IMPLANT
CATHETER OD5 FR L100 CM RADIOPAQUE BRAID (Catheter Miscellaneous) ×1 IMPLANT
CATHETER OD5 FR L100 CM RADIOPAQUE BRAID SELECTIVE BERN2 CURVE (Catheter Miscellaneous) ×1 IMPLANT
DEVICE SEALING BIOABSORBABLE OD5 FR (Sealant) ×1 IMPLANT
DEVICE SEALING BIOABSORBABLE OD5 FR VASCADE COLLAGEN VASCULAR (Sealant) ×1 IMPLANT
GUIDEWIRE VASCULAR OD.035 IN L150 CM L15 (Guidewire) ×1 IMPLANT
GUIDEWIRE VASCULAR OD.035 IN L150 CM L15 CM STARTER BENTSON STRAIGHT (Guidewire) ×1 IMPLANT
GUIDEWIRE VASCULAR OD.035 IN L180 CM L3 (Guidewire) ×1 IMPLANT
GUIDEWIRE VASCULAR OD.035 IN L180 CM L3 CM GLIDEWIRE ANGLE STANDARD (Guidewire) ×1 IMPLANT
GUIDEWIRE VASCULAR OD.038 IN L150 CM L3 (Guidewire) ×1 IMPLANT
GUIDEWIRE VASCULAR OD.038 IN L150 CM L3 CM GLIDEWIRE ANGLE STANDARD (Guidewire) ×1 IMPLANT
KIT INTRODUCER L10 CM .018 IN L40 CM (Introducer) ×1
KIT INTRODUCER L10 CM .018 IN L40 CM STIFFEN DILATOR GUIDEWIRE COIL (Introducer) ×1 IMPLANT
KIT MICROINTRODUCER L7 CM MAX COAXIAL (Introducer) ×1 IMPLANT
KIT MICROINTRODUCER L7 CM MAX COAXIAL GUIDEWIRE ECHOGENIC NEEDLE OD4 (Introducer) ×1 IMPLANT
SHEATH INTRODUCER L10 CM L2.5 CM ID5 FR SNAP ON DILATOR LOCK KINK (Introducer) ×1 IMPLANT
SHEATH INTRODUCER L10 CM SNAP ON DILATOR (Introducer) ×1 IMPLANT

## 2023-02-02 NOTE — Brief Op Note (Signed)
NEUROINTERVENTIONAL RADIOLOGY PROCEDURE NOTE    Date Time: 02/02/23 11:51 AM    Patient Name:   Nicole Mcknight    Date of Operation:   02/02/2023    Providers Performing:   Surgeon(s):  Chitale, Ameet V, MD    Operative Procedure:   Procedure(s):  NEURO ARTERIOGRAM    Preoperative Diagnosis:   Pre-Op Diagnosis Codes:      * Cerebral aneurysm [I67.1]    Postoperative Diagnosis:   aneurysm    Anesthesia:   (  ) SEDATION  (  ) LOCAL  (  ) ANESTHESIA (DEPT OF ANESTHESIOLOGY) )    Estimated Blood Loss:   Minimal     CONTRAST   Omnipaque 240    RADIATION DOSE    13.89min  FLUORO TIME  678  mGy    Findings:   Multiple unruptured intracranial aneurysms.  Absent R A1, 4mm ACoA aneuyrsm filling from L A1  3mm L AChoA aneurysm  3mm L ICA terminus aneurysm  Noted tortuosity of the proximal L CCA.    Before endovascular treatment was performed, the case was discussed in detail with the Neurosurgery attending, and the decision was made to go forward with endovascular treatment.    Full report to follow.        Signed by: Jamey Reas, MD, MD                                                                              FX IVR

## 2023-02-02 NOTE — Discharge Instr - AVS First Page (Signed)
Orlan Leavens, MD  Department of Neurosciences  Cerebrovascular and Endovascular Neurosurgery  [address]  [place], [place] [address]  Phone: [phone] and Fax: [phone]    Post-diagnostic angiogram      RECOVERY EXPECTATIONS:  After your procedure, you can expect to have some soreness in the groin for up to 48 -72 hours.    GENERAL:  Continue to take your home medications.  Drink plenty of fluids for the next four hours; at least one 8 oz. glass every hour.    DRESSING CHANGES:  You may remove your dressing after 24 hours  Gently clean site with soap and water. Dry thoroughly  Keep the area clean and dry until healed.  Avoid tub baths, pools, hot tubs etc for 7 days or until puncture site is completely head.    ACTIVITY:  After your diagnostic procedure, you may return to normal activity after 72 hours. Prior to 72 hours, avoid any straining or heavy lifting.    WORK:   You may return to desk work the following day and return to job that requires heavy lifting after 72 hours.    DRIVING:  No driving for 24 hours after your diagnostic procedure.      CONTACT INFORMATION:   If you have a been told to follow-up in our neurosurgery office, schedule your appointments by calling [phone].  Our office is open Monday-Friday 8:00am-4:30 pm. After 4:30 pm you will be directed to the after-hours service with a neurosurgery team member.  For non-urgent medical questions, correspond with the neurosurgery team via MyChart.    CALL 911 if:  You are experiencing unrelieved chest pain.  You notice bleeding either through the dressing or underneath the skin. If the blood is trapped under the skin, the area will hurt, become swollen and hard. If either happens, lay down flat and hold pressure on the site. This is an arterial bleed, and may become an emergency if unattended.   Your leg becomes cold, numb, painful, pale or grayish in color, or change from usual color/sensation.  When every minute  counts, make sure you can recognize the signs of stroke.

## 2023-02-02 NOTE — H&P (Signed)
NEUROINTERVENTIONAL RADIOLOGY H&P    Date Time: 02/02/23 8:06 AM    PROCEDURALIST COMMENTS BELOW:   Nicole Mcknight is a 57 y.o. female presenting for follow up DSA in the context of multiple bilateral unruptured intracranial aneurysms.      INDICATIONS:   Procedure(s):  NEURO ARTERIOGRAM      PAST MEDICAL HISTORY:   Medical History[1]    PAST SURGICAL HISTORY   History reviewed. No pertinent surgical history.      REVIEW OF SYSTEMS REVIEWED:   YES  ( x )      CURRENT MEDICATION REVIEWED   YES  (  x)        ALLERGIES:     Allergies[2]      PREVIOUS REACTION TO SEDATION MEDICATIONS     NO ( x)   YES ( )    PREVIOUS HIGH RADIATION DOSE CASE IN PAST 6 MONTHS?     NO (x )   YES ( )    PHYSICAL EXAM     AIRWAY CLASSIFICATION:    CLASS I   (  )     CLASS II  (  )    CLASS III  (  x)     CLASS IV  (  )    INTUBATED (  )    CARDIAC :   RRR    LUNGS:   CLEAR    ABDOMEN:   SOFT    NEURO:     normal without focal findings      LABS:     Lab Results   Component Value Date/Time    WBC 7.35 12/31/2022 01:22 PM    WBC 7.48 03/03/2018 06:18 PM    HCT 37.7 12/31/2022 01:22 PM    HCT 42.4 03/03/2018 06:18 PM    INR 1.1 06/12/2015 06:45 PM    PT 14.1 06/12/2015 06:45 PM    PTT 27 06/12/2015 06:45 PM    BUN 18 12/31/2022 01:22 PM    BUN 11.0 03/03/2018 06:18 PM    CREAT 0.8 12/31/2022 01:22 PM    CREAT 0.8 03/03/2018 06:18 PM    CREAT 0.83 05/23/2013 10:17 AM    GLU 78 12/31/2022 01:22 PM    GLU 78 03/03/2018 06:18 PM    K 4.2 12/31/2022 01:22 PM    K 3.7 03/03/2018 06:18 PM             ASA PHYSICAL STATUS   (  )  ASA 1   HEALTHY PATIENT  (  )  ASA 2   MILD SYSTEMIC ILLNESS  ( x )  ASA 3   SYSTEMIC DISEASE, NOT INCAPACITATING  (  )  ASA 4   SEVERE SYSTEMIC DISEASE, DISEASE IS CONSTANT THREAT TO                         LIFE  (  )  ASA 5   MORIBUND CONDITION, NOT EXPECTED TO LIVE >24 HOURS            IRRESPECTIVE OF PROCEDURE  (  )  E           EMERGENCY PROCEDURE       PLANNED SEDATION:   (  ) NO SEDATION  (  x) MODERATE SEDATION  (   ) DEEP SEDATION WITH ANESTHESIA      CONCLUSION:   PATIENT HAS BEEN REASSESSED IMMEDIATELY PRIOR TO THE PROCEDURE   AND IS AN  APPROPRIATE CANDIDATE FOR THE PLANNED SEDATION AND   PROCEDURE.  RISKS, BENEFITS AND ALTERNATIVES TO THE PLANNED   PROCEDURE HAVE BEEN EXPLAINED TO THE PATIENT OR GUARDIAN, INCLUDING THE RISKS OF RADIATION EXPOSURE AND SEDATION.    ( x )  YES  (  )  EMERGENCY CONSENT     The plan for neurointerventional care was discussed with the patient as well as the risks, benefits and alternatives. The risks include but are not limited to major bleeding, possibly requiring transfusion and/or surgery for remedy, infection, permanent weakness, paralysis, coma, stroke, need for further surgery, failure to improve and even death. The patient understands these risks and wished to proceed with treatment as described. No guarantees were provided and I have been asked to proceed.    Signed by Jamey Reas, MD.  Interventional Neuroradiology  Office number: [phone]                 [1]   Past Medical History:  Diagnosis Date    Heart palpitations     been followed in past    Hypertensive disorder    [2]   Allergies  Allergen Reactions    Mixed Grasses     Soy Allergy Itching

## 2023-02-02 NOTE — Progress Notes (Signed)
NEUROSURGERY - POST-PROCEDURE CHECK    S: s/p DSA with Dr. Chauncy Lean.  VSS. Denies n/v.  Pain controlled.    O:  AFVSS  General: NAD  CV: HDS, appears well-perfused  Pulm: Equal chest rise  Abd: NTND  Ext: DP, PT 2+ bilaterally  Groin site c/d/o without erythema, edema, or ecchymosis     Neuro exam-  A&Ox3  Spont, fluent speech.   Following commands.  CN II-XII grossly intact and symmetric b/l  BUE/BLE 5/5      A: PPD #0 diagnostic cerebral angiogram for unruptured intracranial aneurysm. Patient tolerated procedure well and remains at neurological baseline.    P:  - Maintain HOB flat x 2 hours post procedure (OK for Reverse T as needed)  - Groin and pulse checks per protocol  - discharge home when criteria met. Pt already has followup appointment to discuss treatment.    Jamey Reas, MD, MD  Neurosurgery PGY-5  02/02/23  12:09 PM

## 2023-02-10 ENCOUNTER — Encounter: Payer: Self-pay | Admitting: Neurological Surgery

## 2023-02-19 IMAGING — MR MR HEAD W/O CM
11 series · 48 of 48 positions shown · non-contrast
Comparison: None.

CLINICAL DATA: Neuro deficit, acute, stroke suspected

EXAM:
MRI HEAD WITHOUT CONTRAST
TECHNIQUE: Multiplanar, multiecho pulse sequences of the brain and surrounding
structures were obtained without intravenous contrast.

[Series 5: ax dwi_tracew · axial · 3.0mm · 0.71mm/px · z∈[-63,+102]mm · 5 of 56 slices shown]
[im 1/56]
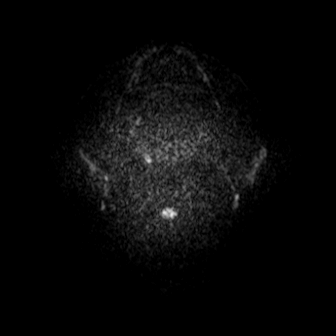
[im 14/56]
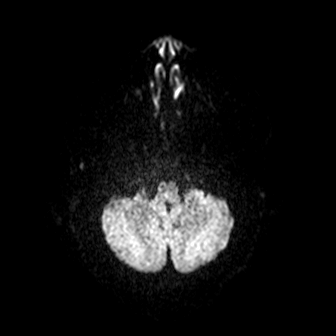
[im 28/56]
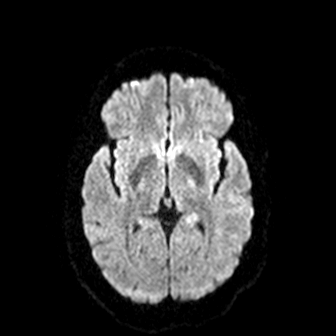
[im 42/56]
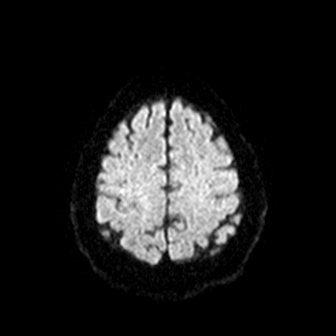
[im 56/56]
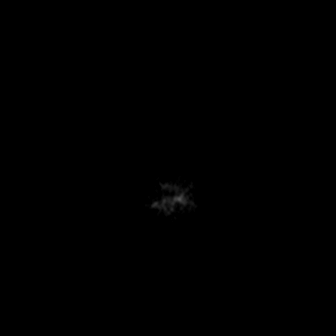

[Series 6: ax dwi_adc · axial · 3.0mm · 0.71mm/px · z∈[-63,+102]mm · 5 of 56 slices shown]
[im 1/56]
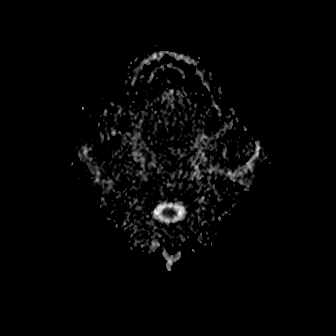
[im 14/56]
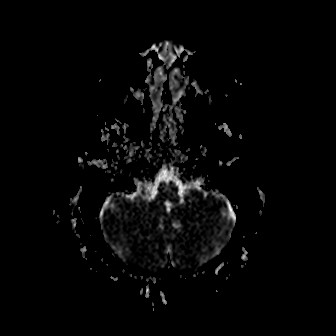
[im 28/56]
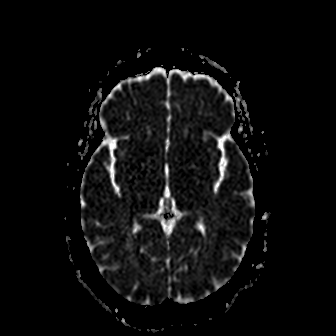
[im 42/56]
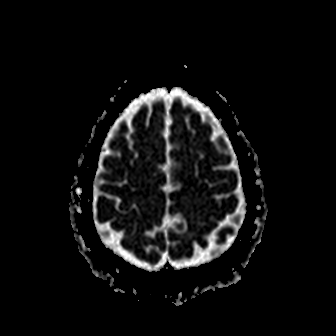
[im 56/56]
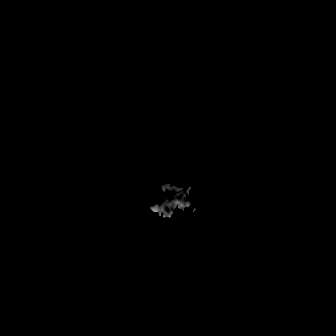

[Series 7: cor dwi_tracew · coronal · 5.0mm · 0.68mm/px · 3 of 40 slices shown]
[im 1/40]
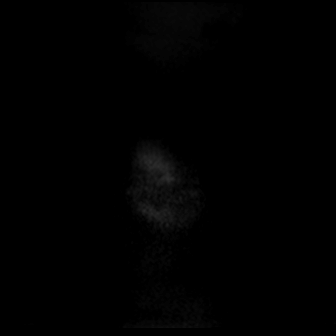
[im 20/40]
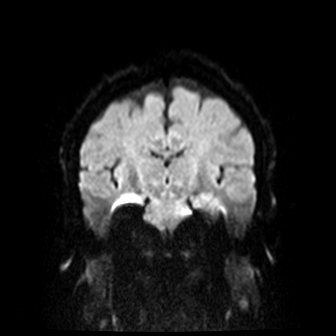
[im 40/40]
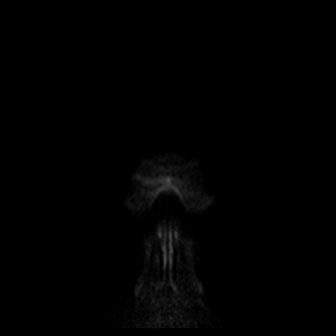

[Series 8: cor dwi_adc · coronal · 5.0mm · 0.68mm/px · 3 of 40 slices shown]
[im 1/40]
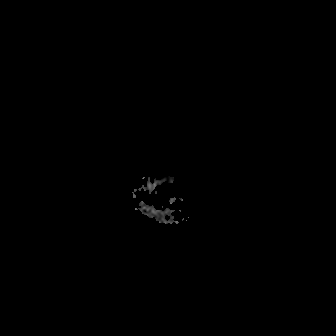
[im 20/40]
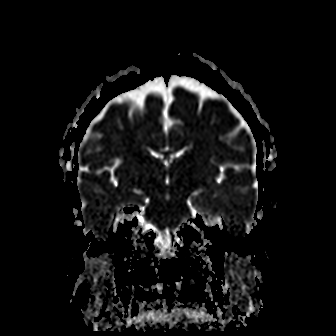
[im 40/40]
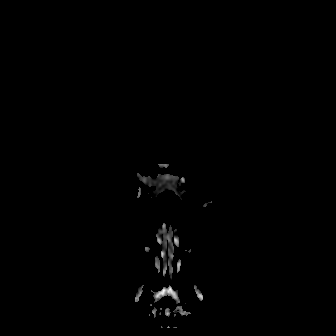

[Series 9: T1 · sagittal · 5.0mm · 0.47mm/px · 2 of 22 slices shown (1 of 2)]
[im 1/22]
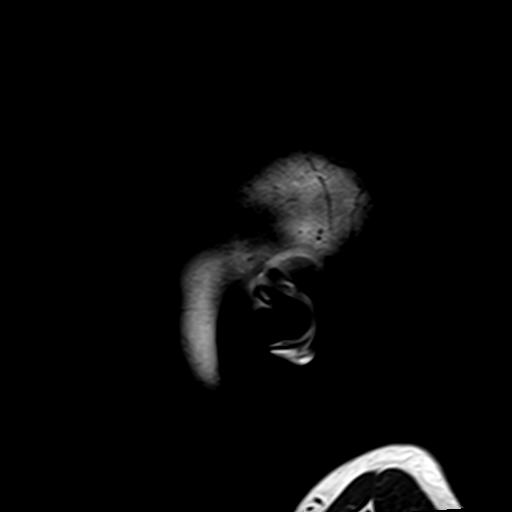
[im 22/22]
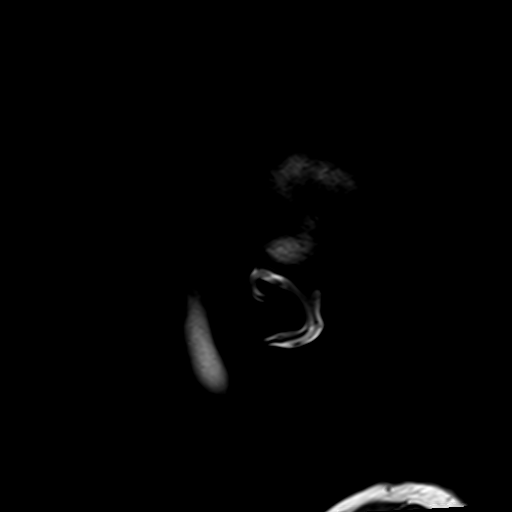

[Series 10: T2 · axial · 5.0mm · 0.86mm/px · z∈[-47,+97]mm · 2 of 25 slices shown (1 of 2)]
[im 1/25]
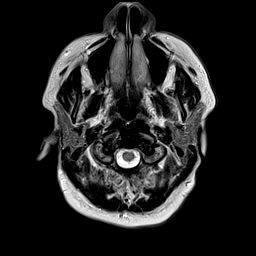
[im 25/25]
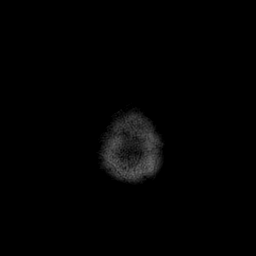

[Series 12: pha_images · axial · 3.0mm · 0.90mm/px · z∈[-51,+102]mm · 4 of 52 slices shown]
[im 1/52]
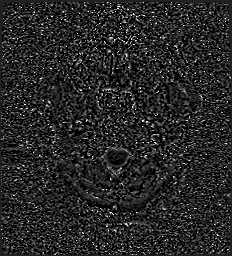
[im 18/52]
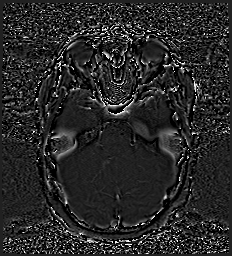
[im 35/52]
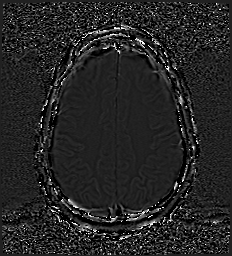
[im 52/52]
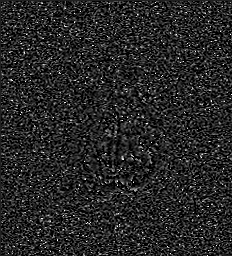

[Series 13: swi_images · axial · 3.0mm · 0.90mm/px · z∈[-51,+102]mm · 4 of 52 slices shown]
[im 1/52]
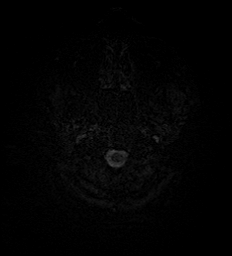
[im 18/52]
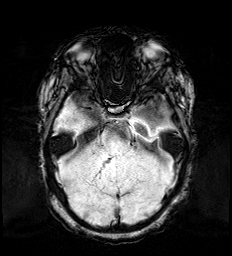
[im 35/52]
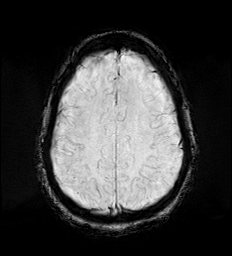
[im 52/52]
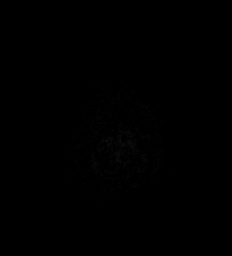

[Series 15: FLAIR · axial · 3.0mm · 0.69mm/px · z∈[-56,+103]mm · 4 of 54 slices shown]
[im 1/54]
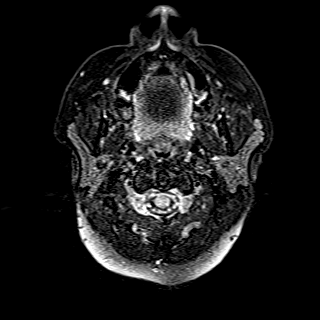
[im 18/54]
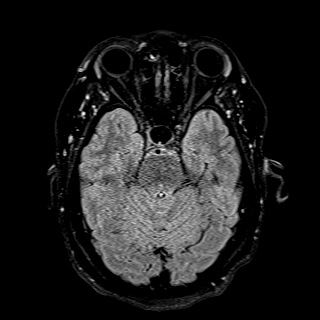
[im 36/54]
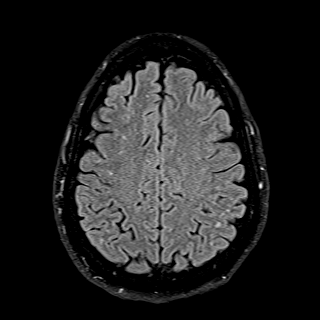
[im 54/54]
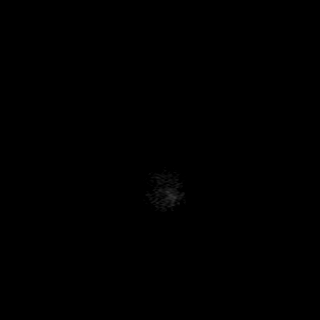

[Series 16: T1 · axial · 1.0mm · 0.98mm/px · z∈[-63,+112]mm · 14 of 174 slices shown (2 of 2)]
[im 1/174]
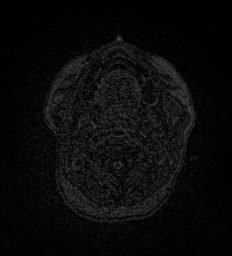
[im 14/174]
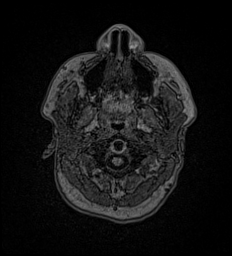
[im 27/174]
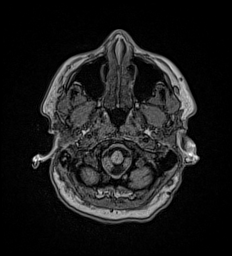
[im 40/174]
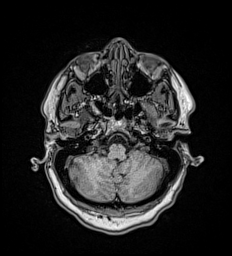
[im 54/174]
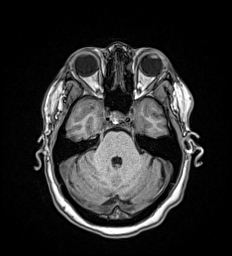
[im 67/174]
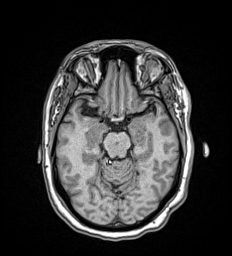
[im 80/174]
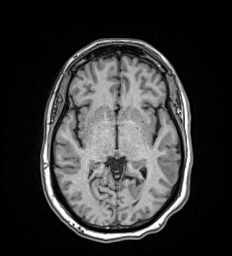
[im 94/174]
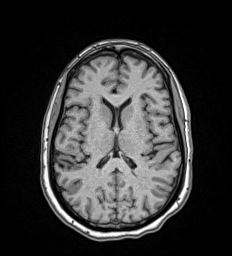
[im 107/174]
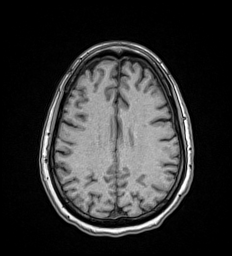
[im 120/174]
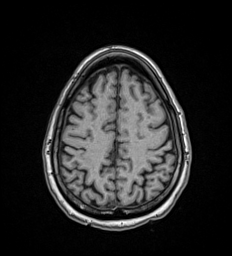
[im 134/174]
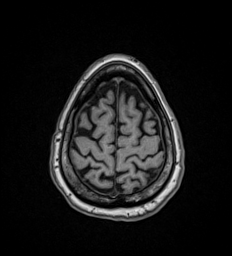
[im 147/174]
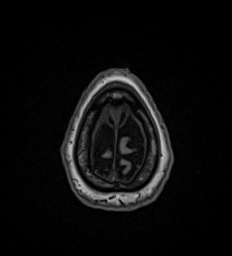
[im 160/174]
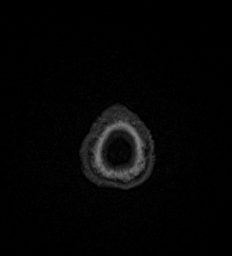
[im 174/174]
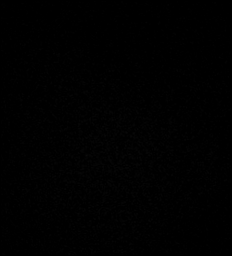

[Series 17: T2 · coronal · 5.0mm · 0.86mm/px · 2 of 30 slices shown (2 of 2)]
[im 1/30]
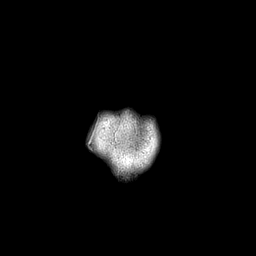
[im 30/30]
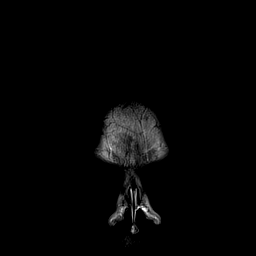

[48 of 48 positions shown; findings below may reference images not displayed]

FINDINGS: Brain: No acute infarct, mass effect or extra-axial collection. No
acute or chronic hemorrhage. There is multifocal hyperintense
T2-weighted signal within the white matter. Parenchymal volume and
CSF spaces are normal. The midline structures are normal.

Vascular: Major flow voids are preserved.

Skull and upper cervical spine: Normal calvarium and skull base.
Visualized upper cervical spine and soft tissues are normal.

Sinuses/Orbits:No paranasal sinus fluid levels or advanced mucosal
thickening. No mastoid or middle ear effusion. Normal orbits.
IMPRESSION: 1. No acute intracranial abnormality.
2. Findings of chronic microvascular ischemia.

## 2023-02-19 NOTE — Progress Notes (Signed)
[facility] Neurosurgery  Follow-up Note    Date: 02/25/2023  Patient Name: Nicole Mcknight, Nicole Mcknight   [id number]  Patient Care Team:  Lerry Liner, MD as PCP - General (Internal Medicine)    History of Present Illness:     Mikia Como is a 57 y.o. female who presents for further counseling regarding her multiple brain aneurysms.    Patient reports no definite family history of cerebral aneurysm. Her maternal aunt may have passed away from an aneurysm. She has a history of smoking as a young woman, but she has not smoked for decades.   She has been seen by Dr. Lanette Hampshire and Dr. Thomasene Lot as well.    At her last visit, we discussed the options of management and was recommended surgical clipping. 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     She presents for further discussion following DSA.    History was obtained from chart review and the patient.  Son was available during the visit, via phone.  Past Medical History:     Past Medical History:   Diagnosis Date    Heart palpitations     been followed in past    Hypertensive disorder        Past Surgical History:     Past Surgical History:   Procedure Laterality Date    NEURO ARTERIOGRAM Bilateral 02/02/2023    Procedure: NEURO ARTERIOGRAM;  Surgeon: Binnie Rail, MD;  Location: [facility];  Service: Interventional Radiology;  Laterality: Bilateral;  d.c---same day       Family History:     Family History   Problem Relation Name Age of Onset    Hypertension Mother      Diabetes Father      Cancer Brother          lung ca    Pulmonary embolism Brother         Social History:     Social History     Socioeconomic History    Marital status: Married     Spouse name: None    Number of children: None    Years of education: None    Highest education level: None   Occupational History    None   Tobacco Use    Smoking status: Never    Smokeless tobacco: Never   Vaping Use    Vaping status: Never Used   Substance and Sexual Activity     Alcohol use: Not Currently    Drug use: No    Sexual activity: None   Other Topics Concern    None   Social History Narrative    None     Social Determinants of Health     Financial Resource Strain: Low Risk  (01/27/2022)    Overall Financial Resource Strain (CARDIA)     Difficulty of Paying Living Expenses: Not hard at all   Food Insecurity: No Food Insecurity (01/27/2022)    Hunger Vital Sign     Worried About Running Out of Food in the Last Year: Never true     Ran Out of Food in the Last Year: Never true   Transportation Needs: No Transportation Needs (01/27/2022)    PRAPARE - Therapist, art (Medical): No     Lack of Transportation (Non-Medical): No   Physical Activity: Insufficiently Active (01/27/2022)    Exercise Vital Sign     Days of Exercise per Week:  3 days     Minutes of Exercise per Session: 10 min   Stress: No Stress Concern Present (01/27/2022)    [employer] of Occupational Health - Occupational Stress Questionnaire     Feeling of Stress : Only a little   Social Connections: Socially Integrated (01/27/2022)    Social Connection and Isolation Panel [NHANES]     Frequency of Communication with Friends and Family: More than three times a week     Frequency of Social Gatherings with Friends and Family: More than three times a week     Attends Religious Services: More than 4 times per year     Active Member of [employer] or Organizations: Yes     Attends Engineer, structural: More than 4 times per year     Marital Status: Married   Catering manager Violence: Not At Risk (01/27/2022)    Humiliation, Afraid, Rape, and Kick questionnaire     Fear of Current or Ex-Partner: No     Emotionally Abused: No     Physically Abused: No     Sexually Abused: No   Housing Stability: Low Risk  (01/27/2022)    Housing Stability Vital Sign     Unable to Pay for Housing in the Last Year: No     Number of Places Lived in the Last Year: 2     Unstable Housing in the Last Year: No       Allergies:      Allergies   Allergen Reactions    Mixed Grasses     Soy Allergy Itching       Medications:     Current Outpatient Medications on File Prior to Visit   Medication Sig Dispense Refill    albuterol sulfate HFA (PROVENTIL) 108 (90 Base) MCG/ACT inhaler albuterol sulfate HFA 90 mcg/actuation aerosol inhaler      cyclobenzaprine (FLEXERIL) 10 MG tablet Take 1 tablet (10 mg) by mouth nightly      lisinopril-hydroCHLOROthiazide (ZESTORETIC) 20-12.5 MG per tablet lisinopril 20 mg-hydrochlorothiazide 12.5 mg tablet   TAKE 1 TABLET BY MOUTH ONCE DAILY AS DIRECTED      metoprolol succinate XL (TOPROL-XL) 25 MG 24 hr tablet Take 1 tablet (25 mg) by mouth daily      Multiple Vitamins-Minerals (MULTIVITAMIN PO) Take by mouth.      atenolol (TENORMIN) 25 MG tablet       Cholecalciferol (VITAMIN D) 2000 UNITS tablet Take 2,000 Units by mouth daily.      lisinopril-hydroCHLOROthiazide (ZESTORETIC) 10-12.5 MG per tablet Take 1 tablet by mouth      methIMAzole (TAPAZOLE) 10 MG tablet 5 mg 1/2 tab daily       No current facility-administered medications on file prior to visit.         Review of Systems:        Vital Signs:     Vitals:    02/25/23 0913   BP: 124/84   Pulse: (!) 54           Physical Exam:      General: The patient was well developed and well nourished.      In acute distress, talking about separation from her 88 year marriage.     Neck: Trachea midline  Pulmonary: normal respiratory effort.   Cardiovascular: no diaphoresis to forehead  Extremities: hands normal in color  Skin: no rash to visible skin  Mental Status: The patient is awake, alert and oriented to person, place, and time.  Affect is normal. Fund of knowledge appropriate.   Recent and remote memory are intact. Attention span and concentration appear normal.  No delusions or hallucinations. Language function is normal.   There is no evidence of aphasia in conversational speech.  Cranial nerves:   -CN II: Visual fields full to confrontation   -CN III, IV,  VI: Pupils equal, round, and reactive to light; extraocular movements intact; no ptosis                                          -CN VII: Face symmetric   -CN VIII: Hearing intact to conversational speech   -CN IX, X:  normal phonation   -CN XI: Symmetric trapezius muscles   Motor: Muscle tone normal without spasticity or flaccidity. No atrophy.    BUE/BLE 5/5  Coordination: No tremors  Gait: Station normal, gait stable   GCS 15          Labs:     Lab Results   Component Value Date    WBC 7.35 12/31/2022    HGB 11.9 12/31/2022    HCT 37.7 12/31/2022    MCV 93.5 12/31/2022    PLT 243 12/31/2022     Lab Results   Component Value Date    NA 141 12/31/2022    K 4.2 12/31/2022    CL 108 12/31/2022    CO2 27 12/31/2022     Lab Results   Component Value Date    INR 1.1 06/12/2015    PT 14.1 06/12/2015     Lab Results   Component Value Date    BUN 18 12/31/2022     Lab Results   Component Value Date    CREAT 0.8 12/31/2022         Imaging:     I personally reviewed the imaging with the patient.      Neuro Arteriogram    Result Date: 02/03/2023  1.  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 2. There is no stenosis at the right carotid bulb by NASCET criteria. 3. There is no stenosis at the left carotid bulb by NASCET criteria. END OF IMPRESSION. Binnie Rail, MD 02/03/2023 3:02 PM     Impression & Plan   57 y.o. female with multiple intracranial aneurysms.     She underwent cerebral angiography last fall at [facility] and at [facility] more recently which was reported as stable.  The most recent angiogram shows stable multiple intracranial aneurysms.    She has 4 intracranial aneurysms in total.      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   This aneurysm can be monitored with serial imaging.    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    Due to the irregular shape of these left sided aneurysms, I believe these do warrant treatment.  We discussed endovascular treatment as well as open craniotomy and clipping.  I believe coil embolization is less safe due to the involvement  of the anterior choroidal artery.      As a result, open craniotomy and clipping appears to be the most favorable option if treatment is decided.  We discussed all risks, benefits, and alternatives in detail.  If observation is preferred, will follow with [facility] in 1 year.      Orders Placed This Encounter   Procedures    MR Angiogram Head WO Contrast     Requested Prescriptions      No prescriptions requested or ordered in this encounter     Follow up:   Patient to consider her options   [facility] head in 1 year    Lakaisha Danish Hart [place], MD  Cristie Renard Matter, NP    I, Babette Relic, NP was acting scribe for provider Binnie Rail, MD on patient Huong Buttry.    I, Jovan Schickling Hart [place], MD personally performed the services documented. Babette Relic, NP is scribing for me on patient Emariah Fayer. This note and the patient instructions accurately reflect work and decisions made by me, Binnie Rail, MD.    We have spent over 50% of the visit counseling in one or more of the following areas:  [1] diagnostic test results, impressions, or recommended studies [2] prognosis, [3] risks, benefits, surgical alternatives of treatment options; surgical clipping of multiple small irregular aneurysm, risk for stroke in endovascular treatment versus monitoring.     [4] instructions for treatment and/or follow-up, [5] Patient and family education. I spent additional time reviewing the  radiography, coordinating care and completing the medical record.

## 2023-02-23 ENCOUNTER — Telehealth: Payer: Self-pay

## 2023-02-23 NOTE — Telephone Encounter (Signed)
Attempted to contact Nicole Mcknight to confirm 02/25/23 9:00 AM appointment with Dr. Chauncy Lean. Unable to confirm. Angio was obtained on 08/26, results are in chart. Left voicemail requesting a call back if appointment reschedule is needed.       1:22 PM  02/23/23  [name], LPN

## 2023-02-25 ENCOUNTER — Ambulatory Visit: Payer: No Typology Code available for payment source | Attending: Neurological Surgery | Admitting: Neurological Surgery

## 2023-02-25 ENCOUNTER — Encounter: Payer: Self-pay | Admitting: Neurological Surgery

## 2023-02-25 VITALS — BP 124/84 | HR 54 | Ht 66.0 in | Wt 247.0 lb

## 2023-02-25 DIAGNOSIS — I671 Cerebral aneurysm, nonruptured: Secondary | ICD-10-CM

## 2023-04-23 ENCOUNTER — Encounter: Payer: Self-pay | Admitting: Neurological Surgery

## 2023-05-19 ENCOUNTER — Other Ambulatory Visit: Payer: Self-pay

## 2023-07-25 ENCOUNTER — Encounter (INDEPENDENT_AMBULATORY_CARE_PROVIDER_SITE_OTHER): Payer: PRIVATE HEALTH INSURANCE

## 2023-07-25 ENCOUNTER — Encounter (INDEPENDENT_AMBULATORY_CARE_PROVIDER_SITE_OTHER): Payer: Self-pay

## 2023-09-25 ENCOUNTER — Emergency Department
Admission: EM | Admit: 2023-09-25 | Discharge: 2023-09-26 | Disposition: A | Discharge status: Home / Self Care | Payer: PRIVATE HEALTH INSURANCE | Attending: Emergency Medicine | Admitting: Emergency Medicine

## 2023-09-25 DIAGNOSIS — R040 Epistaxis: Secondary | ICD-10-CM | POA: Insufficient documentation

## 2023-09-25 HISTORY — DX: Cerebral aneurysm, nonruptured: I67.1

## 2023-09-25 HISTORY — DX: Other supraventricular tachycardia: I47.19

## 2023-09-25 LAB — LAB USE ONLY - CBC WITH DIFFERENTIAL
Absolute Basophils: 0.04 10*3/uL (ref 0.00–0.08)
Absolute Eosinophils: 0.16 10*3/uL (ref 0.00–0.44)
Absolute Immature Granulocytes: 0.01 10*3/uL (ref 0.00–0.07)
Absolute Lymphocytes: 2.55 10*3/uL (ref 0.42–3.22)
Absolute Monocytes: 0.62 10*3/uL (ref 0.21–0.85)
Absolute Neutrophils: 5.83 10*3/uL (ref 1.10–6.33)
Absolute nRBC: 0 10*3/uL (ref <=0.00)
Basophils %: 0.4 %
Eosinophils %: 1.7 %
Hematocrit: 36.4 % (ref 34.7–43.7)
Hemoglobin: 11.5 g/dL (ref 11.4–14.8)
Immature Granulocytes %: 0.1 %
Lymphocytes %: 27.7 %
MCH: 29.9 pg (ref 25.1–33.5)
MCHC: 31.6 g/dL (ref 31.5–35.8)
MCV: 94.5 fL (ref 78.0–96.0)
MPV: 11.1 fL (ref 8.9–12.5)
Monocytes %: 6.7 %
Neutrophils %: 63.4 %
Platelet Count: 276 10*3/uL (ref 142–346)
Preliminary Absolute Neutrophil Count: 5.83 10*3/uL (ref 1.10–6.33)
RBC: 3.85 10*6/uL — ABNORMAL LOW (ref 3.90–5.10)
RDW: 13 % (ref 11–15)
WBC: 9.21 10*3/uL (ref 3.10–9.50)
nRBC %: 0 /100{WBCs} (ref <=0.0)

## 2023-09-25 LAB — PT AND APTT
INR: 1.2 (ref 0.9–1.1)
PT: 13.2 s — ABNORMAL HIGH (ref 10.1–12.9)
PTT: 32 s (ref 27–39)

## 2023-09-25 NOTE — ED Triage Notes (Signed)
 Nicole Mcknight is a 58 y.o. female with pmh of brain aneurism presenting to the ED for nose bleeding on the left nostril at appro., 730 pm tonight that self resolved, bleeding lasting app;riox. 5 mins. Not on anticoagulants. No headache, dizziness, or extremity weakness.

## 2023-09-26 ENCOUNTER — Emergency Department: Payer: PRIVATE HEALTH INSURANCE

## 2023-09-26 LAB — COMPREHENSIVE METABOLIC PANEL
ALT: 14 U/L (ref <=55)
AST (SGOT): 24 U/L (ref <=41)
Albumin/Globulin Ratio: 1 (ref 0.9–2.2)
Albumin: 3.3 g/dL — ABNORMAL LOW (ref 3.5–5.0)
Alkaline Phosphatase: 116 U/L (ref 37–117)
Anion Gap: 7 (ref 5.0–15.0)
BUN: 22 mg/dL — ABNORMAL HIGH (ref 7–21)
Bilirubin, Total: 0.3 mg/dL (ref 0.2–1.2)
CO2: 27 meq/L (ref 17–29)
Calcium: 9 mg/dL (ref 8.5–10.5)
Chloride: 109 meq/L (ref 99–111)
Creatinine: 0.9 mg/dL (ref 0.4–1.0)
GFR: >60 mL/min/{1.73_m2} (ref >=60.0)
Globulin: 3.3 g/dL (ref 2.0–3.6)
Glucose: 116 mg/dL — ABNORMAL HIGH (ref 70–100)
Potassium: 3.6 meq/L (ref 3.5–5.3)
Protein, Total: 6.6 g/dL (ref 6.0–8.3)
Sodium: 143 meq/L (ref 135–145)

## 2023-09-26 MED ORDER — IOHEXOL 350 MG/ML IV SOLN
100.0000 mL | Freq: Once | INTRAVENOUS | Status: AC | PRN
Start: 2023-09-26 — End: 2023-09-26
  Administered 2023-09-26: 80 mL via INTRAVENOUS
  Filled 2023-09-26: qty 100

## 2023-09-26 NOTE — ED Provider Notes (Signed)
 EMERGENCY DEPARTMENT HISTORY AND PHYSICAL EXAM     None        Date: 09/25/2023  Patient Name: Nicole Mcknight    History of Presenting Illness     Chief Complaint   Patient presents with    Epistaxis       Additional History: Nicole Mcknight is a 58 y.o. female presenting to the ED with spontaneous nosebleed from left nare at 7 PM, lasted about 5 minutes, constant stream of blood.  No headache or lightheadedness.  Not on blood thinners.  History of multiple cerebral aneurysms, plan to have them clipped in the future with with neurosurgery.    History Provided By: Patient    Review of Systems (see HPI above).    PCP: Nicole Nose, MD  SPECIALISTS:    Current Medications[1]    Past History     I have reviewed Past Medical History, Surgical History, Family History and Social hx and determinants of health as documented below. Also reviewed current medication history per nursing notes.     Past Medical History:  Past Medical History:   Diagnosis Date    Atrial tachycardia     Brain aneurysm     x4    Heart palpitations     been followed in past    Hypertensive disorder        Past Surgical History:  Past Surgical History[2]    Family History:  Family History[3]    Social History:  Social History[4]    Social Drivers of Health     Alcohol Use: Not At Risk (01/27/2022)    AUDIT-C     Frequency of Alcohol Consumption: Never     Average Number of Drinks: Patient does not drink     Frequency of Binge Drinking: Never   Financial Resource Strain: Low Risk  (01/27/2022)    Overall Financial Resource Strain (CARDIA)     Difficulty of Paying Living Expenses: Not hard at all   Food Insecurity: No Food Insecurity (09/25/2023)    Hunger Vital Sign     Worried About Running Out of Food in the Last Year: Never true     Ran Out of Food in the Last Year: Never true   Transportation Needs: No Transportation Needs (09/25/2023)    PRAPARE - Therapist, art (Medical): No     Lack of Transportation  (Non-Medical): No   Physical Activity: Insufficiently Active (01/27/2022)    Exercise Vital Sign     Days of Exercise per Week: 3 days     Minutes of Exercise per Session: 10 min   Stress: No Stress Concern Present (01/27/2022)    [employer] of Occupational Health - Occupational Stress Questionnaire     Feeling of Stress : Only a little   Social Connections: Socially Integrated (01/27/2022)    Social Connection and Isolation Panel [NHANES]     Frequency of Communication with Friends and Family: More than three times a week     Frequency of Social Gatherings with Friends and Family: More than three times a week     Attends Religious Services: More than 4 times per year     Active Member of [employer] or Organizations: Yes     Attends Banker Meetings: More than 4 times per year     Marital Status: Married   Catering manager Violence: Not At Risk (09/25/2023)    Humiliation, Afraid, Rape, and Kick questionnaire  Fear of Current or Ex-Partner: No     Emotionally Abused: No     Physically Abused: No     Sexually Abused: No   Depression: None or minimal depression (01/27/2022)    PHQ-9     PHQ-9 Score: 2   Housing Stability: Low Risk  (09/25/2023)    Housing Stability Vital Sign     Unable to Pay for Housing in the Last Year: No     Number of Times Moved in the Last Year: 0     Homeless in the Last Year: No   Utilities: Not At Risk (09/25/2023)    AHC Utilities     Threatened with loss of utilities: No   Health Literacy: Not on file   Tobacco Use: Low Risk  (09/25/2023)    Patient History     Smoking Tobacco Use: Never     Smokeless Tobacco Use: Never     Passive Exposure: Not on file       Physical Exam   Vital Signs-Reviewed the patient's vital signs.   Patient Vitals for the past 12 hrs:   BP Temp Pulse Resp   09/26/23 0200 -- 98.2 F (36.8 C) 76 18   09/25/23 2358 113/58 -- (!) 51 --   09/25/23 2303 116/55 -- (!) 57 --   09/25/23 2253 140/79 -- 61 --     Pulse Oximetry Analysis - SpO2: 97 % on  RA    Physical Exam  Vitals and nursing note reviewed.   Constitutional:       Appearance: Normal appearance.   HENT:      Head: Normocephalic and atraumatic.      Mcknight: Mcknight normal.      Comments: Dried blood to left nare  Cardiovascular:      Rate and Rhythm: Normal rate and regular rhythm.   Musculoskeletal:         General: Normal range of motion.   Skin:     General: Skin is warm and dry.   Neurological:      Mental Status: She is alert. Mental status is at baseline.   Psychiatric:         Mood and Affect: Mood normal.         Behavior: Behavior normal.         Medical Decision Making     CHART OWNERSHIP: Dr. Marget Sheffield, MD is the primary emergency physician of record.     Prior notes, labs, and/or imaging were reviewed by me and pertinent findings documented within the ED Course below.       All labs, xrays, and CT/US /MRI (if ordered) have been personally reviewed and interpreted by me, imaging confirmed by radiology report. See interpretation in ED Course below.      Procedures:          Provider Notes: Patient with nosebleed, now resolved.  Likely anterior nosebleed, however after blowing her Mcknight, no source of bleeding noted on visualization.  Less likely bleed from leaking or ruptured cerebral aneurysm, however patient does not have any headache or continued bleeding    Patient Update Notes:    ED Course as of 09/26/23 0206   Sat Sep 26, 2023   0019 Aneurysms as outlined below:  Lobulated right anterolateral projecting aneurysm associated with the  anterior communicating artery measures approximately 0.5 x 0.6 x 0.6 cm (AP  by TR by CC).     Second posterior and left lateral projecting aneurysm from the supraclinoid  segment of the left intracranial internal carotid artery measuring 0.3 x  0.3 x 0.2 cm (AP by TR by CC).     Third superior, anterior and right lateral projecting aneurysm from the  supraclinoid left intracranial internal carotid artery measuring  approximately 0.4 x 0.3 x 0.4 cm (AP by TR  by CC).     Slight bulbous appearance of the right middle cerebral artery bifurcation  with slight lateral projecting outpouching measuring up to 0.1 cm that may  reflect an additional fourth aneurysm.   [MA]   0129 IMPRESSION:      1. No new or acute arterial abnormality is identified. There are stable  multifocal intracranial aneurysms, as above.  2. There is no stenosis of the proximal right cervical internal carotid  artery by NASCET criteria.  3. There is no stenosis of the proximal left cervical internal carotid  artery by NASCET criteria.   [MA]      ED Course User Index  [MA] Ignatz Deis, Ezzie Holstein, MD       Clinical Impression:   1. Epistaxis        ED Disposition       ED Disposition   Discharge    Condition   --    Date/Time   Sat Sep 26, 2023  2:00 AM    Comment   [facility] discharge to home/self care.    Condition at disposition: Stable                 _______________________________    Attestations: This note is prepared by Marget Sheffield, MD.    This note was generated by the Epic EMR system/ Dragon speech recognition and may contain inherent errors or omissions not intended by the user. Grammatical errors, random word insertions, deletions and pronoun errors  are occasional consequences of this technology due to software limitations. Not all errors are caught or corrected. If there are questions or concerns about the content of this note or information contained within the body of this dictation they should be addressed directly with the author for clarification.    _______________________________             [1]   No current facility-administered medications for this encounter.     Current Outpatient Medications   Medication Sig Dispense Refill    albuterol sulfate HFA (PROVENTIL) 108 (90 Base) MCG/ACT inhaler albuterol sulfate HFA 90 mcg/actuation aerosol inhaler      lisinopril -hydroCHLOROthiazide  (ZESTORETIC ) 20-25 MG per tablet Take 1 tablet by mouth daily      Multiple Vitamins-Minerals  (MULTIVITAMIN PO) Take by mouth.     [2]   Past Surgical History:  Procedure Laterality Date    NEURO ARTERIOGRAM Bilateral 02/02/2023    Procedure: NEURO ARTERIOGRAM;  Surgeon: Laverne Potter, MD;  Location: [facility];  Service: Interventional Radiology;  Laterality: Bilateral;  d.c---same day   [3]   Family History  Problem Relation Name Age of Onset    Hypertension Mother      Diabetes Father      Cancer Brother          lung ca    Pulmonary embolism Brother     [4]   Social History  Tobacco Use    Smoking status: Never    Smokeless tobacco: Never   Vaping Use    Vaping status: Never Used   Substance Use Topics    Alcohol use: Not Currently    Drug use: No  Drury Geralds, MD  09/26/23 [phone]

## 2024-04-01 ENCOUNTER — Other Ambulatory Visit: Payer: Self-pay | Admitting: Neurological Surgery

## 2024-04-13 ENCOUNTER — Encounter: Payer: Self-pay | Admitting: Neurological Surgery

## 2024-04-13 ENCOUNTER — Ambulatory Visit: Attending: Neurological Surgery | Admitting: Neurological Surgery

## 2024-04-13 DIAGNOSIS — I671 Cerebral aneurysm, nonruptured: Secondary | ICD-10-CM

## 2024-04-13 NOTE — Progress Notes (Signed)
 [facility] Neurosurgery  Follow-up Note    Date: 04/13/2024  Patient Name: Nicole Mcknight, Nicole Mcknight   [id number]  Patient Care Team:  Fernand Rupert DASEN, MD as PCP - General (Internal Medicine)    History of Present Illness:     Nicole Mcknight is a 58 y.o. female who presents for further counseling regarding her multiple brain aneurysms.    Patient reports no definite family history of cerebral aneurysm. Her maternal aunt may have passed away from an aneurysm. She has a history of smoking as a young woman, but she has not smoked for decades.   She has been seen by Dr. Winford Cramp and Dr. Benjamen Santos as well.    At her last visit, we discussed the options of management, including observation vs endovascular treatment vs surgical clipping. 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 She presents today for further discussion following surveillance MRA.    She reports frequent nose bleed which prompted ED visits. Concerned that it might be related to her aneurysms.    History was obtained from chart review and the patient.    Past Medical History:     Past Medical History:   Diagnosis Date    Atrial tachycardia     Brain aneurysm     x4    Heart palpitations     been followed in past    Hypertensive disorder        Past Surgical History:     Past Surgical History:   Procedure Laterality Date    NEURO ARTERIOGRAM Bilateral 02/02/2023    Procedure: NEURO ARTERIOGRAM;  Surgeon: Ardine Lugene GAILS, MD;  Location: [facility];  Service: Interventional Radiology;  Laterality: Bilateral;  d.c---same day       Family History:     Family History   Problem Relation Name Age of Onset    Hypertension Mother      Diabetes Father      Cancer Brother          lung ca    Pulmonary embolism Brother         Social History:     Social History     Socioeconomic History    Marital status: Married   Tobacco Use    Smoking status: Never    Smokeless tobacco: Never   Vaping Use    Vaping status: Never Used   Substance and  Sexual Activity    Alcohol use: Not Currently    Drug use: No     Social Drivers of Psychologist, Prison And Probation Services Strain: Low Risk (04/13/2024)    Overall Financial Resource Strain (CARDIA)     Difficulty of Paying Living Expenses: Not very hard   Food Insecurity: No Food Insecurity (04/13/2024)    Hunger Vital Sign     Worried About Running Out of Food in the Last Year: Never true     Ran Out of Food in the Last Year: Never true   Transportation Needs: No Transportation Needs (04/13/2024)    PRAPARE - Therapist, Art (Medical): No     Lack of Transportation (Non-Medical): No   Physical Activity: Insufficiently Active (04/13/2024)    Exercise Vital Sign     Days of Exercise per Week: 6 days     Minutes of Exercise per Session: 10 min   Stress: No Stress Concern Present (04/13/2024)    [employer] of Occupational Health - Occupational  Stress Questionnaire     Feeling of Stress : Only a little   Social Connections: Moderately Integrated (03/14/2024)    Received from [facility]    Social Connection and Isolation Panel     In a typical week, how many times do you talk on the phone with family, friends, or neighbors?: Three times a week     How often do you get together with friends or relatives?: Twice a week     How often do you attend church or religious services?: More than 4 times per year     Do you belong to any clubs or organizations such as church groups, unions, fraternal or athletic groups, or school groups?: Yes     How often do you attend meetings of the clubs or organizations you belong to?: More than 4 times per year     Are you married, widowed, divorced, separated, never married, or living with a partner?: Separated   Intimate Partner Violence: Not At Risk (04/13/2024)    Humiliation, Afraid, Rape, and Kick questionnaire     Fear of Current or Ex-Partner: No     Emotionally Abused: No     Physically Abused: No     Sexually Abused: No   Recent Concern: Intimate Partner  Violence - At Risk (03/14/2024)    Received from [facility], Afraid, Rape, and Kick questionnaire     Within the last year, have you been afraid of your partner or ex-partner?: No     Within the last year, have you been humiliated or emotionally abused in other ways by your partner or ex-partner?: Yes     Within the last year, have you been kicked, hit, slapped, or otherwise physically hurt by your partner or ex-partner?: No     Within the last year, have you been raped or forced to have any kind of sexual activity by your partner or ex-partner?: No   Housing Stability: At Risk (04/13/2024)    Housing Stability NCSS     Do you have housing?: No     Are you worried about losing your housing?: No       Allergies:     Allergies   Allergen Reactions    Mixed Grasses     Soy Allergy (Obsolete) Itching       Medications:     Current Outpatient Medications on File Prior to Visit   Medication Sig Dispense Refill    albuterol sulfate HFA (PROVENTIL) 108 (90 Base) MCG/ACT inhaler albuterol sulfate HFA 90 mcg/actuation aerosol inhaler      lisinopril -hydroCHLOROthiazide  (ZESTORETIC ) 20-25 MG per tablet Take 1 tablet by mouth daily      Multiple Vitamins-Minerals (MULTIVITAMIN PO) Take by mouth.       No current facility-administered medications on file prior to visit.         Review of Systems:        Vital Signs:     There were no vitals filed for this visit.          Physical Exam:      General: The patient was well developed and well nourished.      In acute distress, talking about separation from her 15 year marriage.     Neck: Trachea midline  Pulmonary: normal respiratory effort.   Cardiovascular: no diaphoresis to forehead  Extremities: hands normal in color  Skin: no rash to visible skin  Mental Status: The patient is awake, alert and oriented  to person, place, and time.    Affect is normal. Fund of knowledge appropriate.   Recent and remote memory are intact. Attention span and concentration appear  normal.  No delusions or hallucinations. Language function is normal.   There is no evidence of aphasia in conversational speech.  Cranial nerves:   -CN II: Visual fields full to confrontation   -CN III, IV, VI: Pupils equal, round, and reactive to light; extraocular movements intact; no ptosis                                          -CN VII: Face symmetric   -CN VIII: Hearing intact to conversational speech   -CN IX, X:  normal phonation   -CN XI: Symmetric trapezius muscles   Motor: Muscle tone normal without spasticity or flaccidity. No atrophy.    BUE/BLE 5/5  Coordination: No tremors  Gait: Station normal, gait stable   GCS 15          Labs:     Lab Results   Component Value Date    WBC 9.21 09/25/2023    HGB 11.5 09/25/2023    HCT 36.4 09/25/2023    MCV 94.5 09/25/2023    PLT 276 09/25/2023     Lab Results   Component Value Date    NA 143 09/25/2023    K 3.6 09/25/2023    CL 109 09/25/2023    CO2 27 09/25/2023     Lab Results   Component Value Date    INR 1.2 09/25/2023    INR 1.1 06/12/2015    PT 13.2 (H) 09/25/2023    PT 14.1 06/12/2015     Lab Results   Component Value Date    BUN 22 (H) 09/25/2023     Lab Results   Component Value Date    CREAT 0.9 09/25/2023         Imaging:     I personally reviewed the imaging with the patient.      MR Angiogram Head WO Contrast  Result Date: 04/08/2024  Stable multiple intracranial aneurysms, as discussed above. Electronically signed by: Gala GRADE. Albina M.D. [facility] KYM: 04/08/24      Impression & Plan   58 y.o. female with multiple intracranial aneurysms.     She underwent cerebral angiography last fall at [facility] and at [facility] more recently which was reported as stable.  The most recent angiogram shows stable multiple intracranial aneurysms.    She has 3 intracranial aneurysms in total.      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  This aneurysm can be  monitored with serial imaging.    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    Size of these aneurysms have unchanged in recent MRA. Though the risk of aneurysm rupture is low, due to the irregular shape of these left sided aneurysms, I believe treatment is reasonable.  We discussed endovascular treatment as well as open craniotomy and clipping.  I believe coil embolization is less safe due to the involvement of the anterior choroidal artery.      As a result,  open craniotomy and clipping appears to be the most favorable option if treatment is decided.  We discussed all risks, benefits, and alternatives in detail.  If observation is preferred, will follow with MRA in 1 year.      No orders of the defined types were placed in this encounter.    Requested Prescriptions      No prescriptions requested or ordered in this encounter     Follow up:   Patient to consider her options   MRA head in 1 year    Deeksha Cotrell LULLA Milks, MD  Reena GORMAN Cramp, FNP    I, Reena GORMAN Cramp, FNP was acting scribe for provider Lugene LULLA Milks, MD on patient Avelina Jenkins Dell.    I, Levette Paulick LULLA Milks, MD personally performed the services documented. Reena GORMAN Cramp, FNP is scribing for me on patient Maycee Blasco. This note and the patient instructions accurately reflect work and decisions made by me, Lugene LULLA Milks, MD.

## 2024-04-20 ENCOUNTER — Ambulatory Visit: Admitting: Neurological Surgery

## 2025-04-12 ENCOUNTER — Ambulatory Visit: Admitting: Neurological Surgery
